# Patient Record
Sex: Female | Born: 1935 | Race: White | Hispanic: No | Smoking: Never smoker
Health system: Southern US, Community
[De-identification: ages and names within clinical notes are randomized; demographics above are authoritative.]

## PROBLEM LIST (undated history)

## (undated) DIAGNOSIS — I509 Heart failure, unspecified: Secondary | ICD-10-CM

## (undated) DIAGNOSIS — N183 Chronic kidney disease, stage 3 unspecified: Secondary | ICD-10-CM

## (undated) DIAGNOSIS — F329 Major depressive disorder, single episode, unspecified: Secondary | ICD-10-CM

## (undated) DIAGNOSIS — E039 Hypothyroidism, unspecified: Secondary | ICD-10-CM

## (undated) DIAGNOSIS — E785 Hyperlipidemia, unspecified: Secondary | ICD-10-CM

## (undated) DIAGNOSIS — F32A Depression, unspecified: Secondary | ICD-10-CM

## (undated) DIAGNOSIS — I10 Essential (primary) hypertension: Secondary | ICD-10-CM

## (undated) DIAGNOSIS — F419 Anxiety disorder, unspecified: Secondary | ICD-10-CM

## (undated) HISTORY — PX: CHOLECYSTECTOMY: SHX55

## (undated) HISTORY — DX: Depression, unspecified: F32.A

## (undated) HISTORY — DX: Anxiety disorder, unspecified: F41.9

## (undated) HISTORY — DX: Major depressive disorder, single episode, unspecified: F32.9

---

## 2005-04-09 ENCOUNTER — Ambulatory Visit: Payer: Self-pay | Admitting: Internal Medicine

## 2006-03-26 ENCOUNTER — Other Ambulatory Visit: Payer: Self-pay

## 2006-03-26 ENCOUNTER — Emergency Department: Payer: Self-pay | Admitting: Emergency Medicine

## 2006-04-10 ENCOUNTER — Ambulatory Visit: Payer: Self-pay | Admitting: Internal Medicine

## 2007-06-28 ENCOUNTER — Ambulatory Visit: Payer: Self-pay | Admitting: Internal Medicine

## 2008-06-14 ENCOUNTER — Ambulatory Visit: Payer: Self-pay | Admitting: Internal Medicine

## 2009-06-15 ENCOUNTER — Ambulatory Visit: Payer: Self-pay | Admitting: Internal Medicine

## 2009-11-26 ENCOUNTER — Ambulatory Visit: Payer: Self-pay | Admitting: Internal Medicine

## 2013-08-18 ENCOUNTER — Emergency Department: Payer: Self-pay | Admitting: Emergency Medicine

## 2014-02-12 ENCOUNTER — Ambulatory Visit: Payer: Self-pay | Admitting: Unknown Physician Specialty

## 2014-04-01 DIAGNOSIS — M5136 Other intervertebral disc degeneration, lumbar region: Secondary | ICD-10-CM | POA: Insufficient documentation

## 2014-04-01 DIAGNOSIS — M5416 Radiculopathy, lumbar region: Secondary | ICD-10-CM | POA: Insufficient documentation

## 2014-04-01 DIAGNOSIS — M51369 Other intervertebral disc degeneration, lumbar region without mention of lumbar back pain or lower extremity pain: Secondary | ICD-10-CM | POA: Insufficient documentation

## 2014-04-01 DIAGNOSIS — M48062 Spinal stenosis, lumbar region with neurogenic claudication: Secondary | ICD-10-CM | POA: Insufficient documentation

## 2014-06-13 DIAGNOSIS — F329 Major depressive disorder, single episode, unspecified: Secondary | ICD-10-CM | POA: Insufficient documentation

## 2014-06-13 DIAGNOSIS — D721 Eosinophilia: Secondary | ICD-10-CM

## 2014-06-13 DIAGNOSIS — F32A Depression, unspecified: Secondary | ICD-10-CM | POA: Insufficient documentation

## 2014-06-13 DIAGNOSIS — R898 Other abnormal findings in specimens from other organs, systems and tissues: Secondary | ICD-10-CM | POA: Insufficient documentation

## 2014-06-13 DIAGNOSIS — H547 Unspecified visual loss: Secondary | ICD-10-CM | POA: Insufficient documentation

## 2014-06-13 DIAGNOSIS — I10 Essential (primary) hypertension: Secondary | ICD-10-CM | POA: Insufficient documentation

## 2014-06-14 DIAGNOSIS — E876 Hypokalemia: Secondary | ICD-10-CM | POA: Insufficient documentation

## 2014-12-13 DIAGNOSIS — Z79899 Other long term (current) drug therapy: Secondary | ICD-10-CM | POA: Insufficient documentation

## 2015-06-15 DIAGNOSIS — G250 Essential tremor: Secondary | ICD-10-CM | POA: Insufficient documentation

## 2015-07-26 DIAGNOSIS — F0280 Dementia in other diseases classified elsewhere without behavioral disturbance: Secondary | ICD-10-CM | POA: Insufficient documentation

## 2015-07-26 DIAGNOSIS — G301 Alzheimer's disease with late onset: Secondary | ICD-10-CM

## 2015-08-24 DIAGNOSIS — F3342 Major depressive disorder, recurrent, in full remission: Secondary | ICD-10-CM | POA: Insufficient documentation

## 2015-10-10 DIAGNOSIS — I340 Nonrheumatic mitral (valve) insufficiency: Secondary | ICD-10-CM | POA: Insufficient documentation

## 2015-11-21 ENCOUNTER — Encounter: Payer: Self-pay | Admitting: Emergency Medicine

## 2015-11-21 ENCOUNTER — Ambulatory Visit
Admission: EM | Admit: 2015-11-21 | Discharge: 2015-11-21 | Disposition: A | Discharge status: Home / Self Care | Payer: Medicare Other | Attending: Family Medicine | Admitting: Family Medicine

## 2015-11-21 DIAGNOSIS — M25512 Pain in left shoulder: Secondary | ICD-10-CM | POA: Insufficient documentation

## 2015-11-21 DIAGNOSIS — Z8679 Personal history of other diseases of the circulatory system: Secondary | ICD-10-CM | POA: Diagnosis not present

## 2015-11-21 HISTORY — DX: Essential (primary) hypertension: I10

## 2015-11-21 NOTE — ED Provider Notes (Addendum)
CSN: [id number]     Arrival date & time 11/21/15  1242 History   First MD Initiated Contact with Patient 11/21/15 1334    Nurses notes were reviewed.   No results found for this or any previous visit. Chief Complaint  Patient presents with  . Arm Pain    Patient is brought in by her nurse son and daughter. Since 82 year old white female who unfortunately lost her husband last month. Apparently for the last 3 days she's been having pain in her left shoulder worrisome point is that the pain less in her left shoulder is also gone upper left neck as well. She's also has some shortness of breath according to daughter she always has shortness of breath when she just felt too much. Patient does have a history of heart disease and she takes Lasix on a regular basis because of CHF history in the past. Patient has difficulty hearing and so because of her difficulty hearing is also difficult to getting a full history and full details. She's not having his physical therapy to help her with overall activity and she's been doing some increased squats her daughter thinks she may have just her shoulder with the increase squatting she is doing.   (Consider location/radiation/quality/duration/timing/severity/associated sxs/prior Treatment) Patient is a 82 y.o. female presenting with arm pain. The history is provided by the patient and a relative. The history is limited by the condition of the patient. No language interpreter was used.  Arm Pain This is a new problem. The current episode started more than 2 days ago. The problem has been gradually worsening. Associated symptoms include shortness of breath. Pertinent negatives include no chest pain, no abdominal pain and no headaches. The symptoms are aggravated by twisting. She has tried nothing for the symptoms. The treatment provided no relief.    Past Medical History  Diagnosis Date  . Hypertension    Past Surgical History  Procedure Laterality Date  .  Cholecystectomy     History reviewed. No pertinent family history. Social History  Substance Use Topics  . Smoking status: Never Smoker   . Smokeless tobacco: None  . Alcohol Use: No   OB History    No data available     Review of Systems  HENT: Positive for hearing loss.   Respiratory: Positive for shortness of breath.   Cardiovascular: Negative for chest pain.  Gastrointestinal: Negative for abdominal pain.  Musculoskeletal: Positive for myalgias.  Neurological: Negative for headaches.    Allergies  Review of patient's allergies indicates no known allergies.  Home Medications   Prior to Admission medications   Medication Sig Start Date End Date Taking? Authorizing Provider  atorvastatin (LIPITOR) 20 MG tablet Take 20 mg by mouth daily.   Yes Historical Provider, MD  citalopram (CELEXA) 20 MG tablet Take 20 mg by mouth daily.   Yes Historical Provider, MD  furosemide (LASIX) 20 MG tablet Take 20 mg by mouth daily.   Yes Historical Provider, MD  HYDROcodone-acetaminophen (NORCO/VICODIN) 5-325 MG tablet Take 1 tablet by mouth every 6 (six) hours as needed for moderate pain.   Yes Historical Provider, MD  levothyroxine (SYNTHROID, LEVOTHROID) 88 MCG tablet Take 88 mcg by mouth daily before breakfast.   Yes Historical Provider, MD  losartan (COZAAR) 50 MG tablet Take 50 mg by mouth daily.   Yes Historical Provider, MD  potassium chloride SA (K-DUR,KLOR-CON) 20 MEQ tablet Take 20 mEq by mouth 2 (two) times daily.   Yes Historical Provider, MD  primidone (MYSOLINE) 50 MG tablet Take 50 mg by mouth daily.   Yes Historical Provider, MD   Meds Ordered and Administered this Visit  Medications - No data to display  BP 148/78 mmHg  Pulse 84  Temp(Src) 98.5 F (36.9 C) (Tympanic)  Resp 16  SpO2 99% No data found.   Physical Exam  Constitutional:  Elderly white female frail  HENT:  Head: Normocephalic.  Right Ear: External ear normal.  Left Ear: External ear normal.    Mouth/Throat: Oropharynx is clear and moist.  Eyes:  Patient is legally blind  Neck: Normal range of motion. Neck supple.  Cardiovascular: Normal rate.  An irregular rhythm present.  Pulmonary/Chest: Breath sounds normal. No respiratory distress.  Abdominal: Soft. Bowel sounds are normal.  Musculoskeletal: She exhibits tenderness.       Left shoulder: She exhibits tenderness and pain.       Arms: Patient is tenderness over the left shoulder along the left deltoid. She is able to move it. When I palpate deltoid muscle distribution of the discomfort she has not the pain she has with the pain radiates up her neck.  Neurological: She is alert.  Skin: Skin is warm.  Psychiatric: She has a normal mood and affect.  Vitals reviewed.   ED Course  Procedures (including critical care time)  Labs Review Labs Reviewed - No data to display  Imaging Review No results found.   Visual Acuity Review  Right Eye Distance:   Left Eye Distance:   Bilateral Distance:    Right Eye Near:   Left Eye Near:    Bilateral Near:         MDM   1. Shoulder pain, acute, left   2. Hx of chronic ischemic heart disease    Patient is here because of left arm pain that radiates up her neck. She has a history of coronary artery disease as well as CHF. At this point of time explained to the family this being an urgent care I cannot Reassure the family that nothing cardiac is going on with the patient. Should be noted that when her son called her regular PCP he did instruct him to take her to the ED and not the urgent care. Coming to urgent care with something that the son and daughter decided to do to expedite her care. And evaluation. Explained to her son and daughter that the urgent care doesn't function in that way and that if they're really concerned about her heart that they should go to the emergency room of the choice for further evaluation. Also offered calling the rescue squad at this time to  facilitate her transfer to the ER. If there comfortable with the risk they can take her there themselves. I also explained to them that even though she is a DO NOT RESUSCITATE it does not mean no treatment and that if she is having any type of infarct that at the ER a cardiologist can make a decision as far as what type of all thrombolytic to use for intervention if it is needed.   ED ECG REPORT   Date: 11/21/2015  EKG Time: 2:16 PM  Rate: 96  Rhythm: sinus arrhythmia and premature ventricular contractions (PVC),  there are no previous tracings available for comparison, nonspecific ST and T waves changes, occasional PVC noted,   Intervals:none  ST&T Change: nonspecific  Narrative Interpretation: patient w/abnormal EKG w/nonspecific St and T wave changes       After discussion with  son and daughter they will take her home and watch her. Patient does not want to go to the ED at this time. They're aware though that if she starts having worsening of her symptoms though need to take her to the ED of the choice or will follow-up with her PCP later this week if the pain continues.     Note: This dictation was prepared with Dragon dictation along with smaller phrase technology. Any transcriptional errors that result from this process are unintentional.  Should be noted that note was edited on 06/07/2016 due to coding query.   Hassan Rowan, MD 11/21/15 1417  Hassan Rowan, MD 11/21/15 1417  Hassan Rowan, MD 06/07/16 1331

## 2015-11-21 NOTE — Discharge Instructions (Signed)
Per discussion with urgent care cannot state that there is nothing cardiac especially ischemic-wise going on with your mother. After discussing the risks of death and missed cardiac evaluation understand that she won't take patient home and observe. This is truly an informed decision family is making at this time. Recommend though that if her condition becomes worse today or tomorrow to please take her to the ED of your choice for furth Musculoskeletal Pain Musculoskeletal pain is muscle and boney aches and pains. These pains can occur in any part of the body. Your caregiver may treat you without knowing the cause of the pain. They may treat you if blood or urine tests, X-rays, and other tests were normal.  CAUSES There is often not a definite cause or reason for these pains. These pains may be caused by a type of germ (virus). The discomfort may also come from overuse. Overuse includes working out too hard when your body is not fit. Boney aches also come from weather changes. Bone is sensitive to atmospheric pressure changes. HOME CARE INSTRUCTIONS  1. Ask when your test results will be ready. Make sure you get your test results. 2. Only take over-the-counter or prescription medicines for pain, discomfort, or fever as directed by your caregiver. If you were given medications for your condition, do not drive, operate machinery or power tools, or sign legal documents for 24 hours. Do not drink alcohol. Do not take sleeping pills or other medications that may interfere with treatment. 3. Continue all activities unless the activities cause more pain. When the pain lessens, slowly resume normal activities. Gradually increase the intensity and duration of the activities or exercise. 4. During periods of severe pain, bed rest may be helpful. Lay or sit in any position that is comfortable. 5. Putting ice on the injured area. 1. Put ice in a bag. 2. Place a towel between your skin and the bag. 3. Leave the ice  on for 15 to 20 minutes, 3 to 4 times a day. 6. Follow up with your caregiver for continued problems and no reason can be found for the pain. If the pain becomes worse or does not go away, it may be necessary to repeat tests or do additional testing. Your caregiver may need to look further for a possible cause. SEEK IMMEDIATE MEDICAL CARE IF: 1. You have pain that is getting worse and is not relieved by medications. 2. You develop chest pain that is associated with shortness or breath, sweating, feeling sick to your stomach (nauseous), or throw up (vomit). 3. Your pain becomes localized to the abdomen. 4. You develop any new symptoms that seem different or that concern you. MAKE SURE YOU:  1. Understand these instructions. 2. Will watch your condition. 3. Will get help right away if you are not doing well or get worse.   This information is not intended to replace advice given to you by your health care provider. Make sure you discuss any questions you have with your health care provider.   Document Released: 09/10/2005 Document Revised: 12/03/2011 Document Reviewed: 05/15/2013 Elsevier Interactive Patient Education 2016 [employer]  Shoulder Pain The shoulder is the joint that connects your arm to your body. Muscles and band-like tissues that connect bones to muscles (tendons) hold the joint together. Shoulder pain is felt if an injury or medical problem affects one or more parts of the shoulder. HOME CARE  7. Put ice on the sore area. 1. Put ice in a plastic bag. 2.  Place a towel between your skin and the bag. 3. Leave the ice on for 15-20 minutes, 03-04 times a day for the first 2 days. 8. Stop using cold packs if they do not help with the pain. 9. If you were given something to keep your shoulder from moving (sling; shoulder immobilizer), wear it as told. Only take it off to shower or bathe. 10. Move your arm as little as possible, but keep your hand moving to prevent puffiness  (swelling). 11. Squeeze a soft ball or foam pad as much as possible to help prevent swelling. 12. Take medicine as told by your doctor. GET HELP IF: 5. You have progressing new pain in your arm, hand, or fingers. 6. Your hand or fingers get cold. 7. Your medicine does not help lessen your pain. GET HELP RIGHT AWAY IF:  4. Your arm, hand, or fingers are numb or tingling. 5. Your arm, hand, or fingers are puffy (swollen), painful, or turn white or blue. MAKE SURE YOU:  1. Understand these instructions. 2. Will watch your condition. 3. Will get help right away if you are not doing well or get worse.   This information is not intended to replace advice given to you by your health care provider. Make sure you discuss any questions you have with your health care provider.   Document Released: 02/27/2008 Document Revised: 10/01/2014 Document Reviewed: 01/03/2015 Elsevier Interactive Patient Education 2016 [employer]  Shoulder Range of Motion Exercises Shoulder range of motion (ROM) exercises are designed to keep the shoulder moving freely. They are often recommended for people who have shoulder pain. MOVEMENT EXERCISE When you are able, do this exercise 5-6 days per week, or as told by your health care provider. Work toward doing 2 sets of 10 swings. Pendulum Exercise How To Do This Exercise Lying Down 13. Lie face-down on a bed with your abdomen close to the side of the bed. 14. Let your arm hang over the side of the bed. 15. Relax your shoulder, arm, and hand. 16. Slowly and gently swing your arm forward and back. Do not use your neck muscles to swing your arm. They should be relaxed. If you are struggling to swing your arm, have someone gently swing it for you. When you do this exercise for the first time, swing your arm at a 15 degree angle for 15 seconds, or swing your arm 10 times. As pain lessens over time, increase the angle of the swing to 30-45 degrees. 17. Repeat steps 1-4 with  the other arm. How To Do This Exercise While Standing 8. Stand next to a sturdy chair or table and hold on to it with your hand.  Bend forward at the waist.  Bend your knees slightly.  Relax your other arm and let it hang limp.  Relax the shoulder blade of the arm that is hanging and let it drop.  While keeping your shoulder relaxed, use body motion to swing your arm in small circles. The first time you do this exercise, swing your arm for about 30 seconds or 10 times. When you do it next time, swing your arm for a little longer.  Stand up tall and relax.  Repeat steps 1-7, this time changing the direction of the circles. 9. Repeat steps 1-8 with the other arm. STRETCHING EXERCISES Do these exercises 3-4 times per day on 5-6 days per week or as told by your health care provider. Work toward holding the stretch for 20 seconds. Stretching  Exercise 1 6. Lift your arm straight out in front of you. 7. Bend your arm 90 degrees at the elbow (right angle) so your forearm goes across your body and looks like the letter "L." 8. Use your other arm to gently pull the elbow forward and across your body. 9. Repeat steps 1-3 with the other arm. Stretching Exercise 2 You will need a towel or rope for this exercise. 4. Bend one arm behind your back with the palm facing outward. 5. Hold a towel with your other hand. 6. Reach the arm that holds the towel above your head, and bend that arm at the elbow. Your wrist should be behind your neck. 7. Use your free hand to grab the free end of the towel. 8. With the higher hand, gently pull the towel up behind you. 9. With the lower hand, pull the towel down behind you. 10. Repeat steps 1-6 with the other arm. STRENGTHENING EXERCISES Do each of these exercises at four different times of day (sessions) every day or as told by your health care provider. To begin with, repeat each exercise 5 times (repetitions). Work toward doing 3 sets of 12 repetitions or as  told by your health care provider. Strengthening Exercise 1 You will need a light weight for this activity. As you grow stronger, you may use a heavier weight. 1. Standing with a weight in your hand, lift your arm straight out to the side until it is at the same height as your shoulder. 2. Bend your arm at 90 degrees so that your fingers are pointing to the ceiling. 3. Slowly raise your hand until your arm is straight up in the air. 4. Repeat steps 1-3 with the other arm. Strengthening Exercise 2 You will need a light weight for this activity. As you grow stronger, you may use a heavier weight. 1. Standing with a weight in your hand, gradually move your straight arm in an arc, starting at your side, then out in front of you, then straight up over your head. 2. Gradually move your other arm in an arc, starting at your side, then out in front of you, then straight up over your head. 3. Repeat steps 1-2 with the other arm. Strengthening Exercise 3 You will need an elastic band for this activity. As you grow stronger, gradually increase the size of the bands or increase the number of bands that you use at one time. 1. While standing, hold an elastic band in one hand and raise that arm up in the air. 2. With your other hand, pull down the band until that hand is by your side. 3. Repeat steps 1-2 with the other arm.   This information is not intended to replace advice given to you by your health care provider. Make sure you discuss any questions you have with your health care provider.   Document Released: 06/09/2003 Document Revised: 01/25/2015 Document Reviewed: 09/06/2014 Elsevier Interactive Patient Education [employer] er evaluation will respect her wish to take her home at this time.

## 2015-11-21 NOTE — ED Notes (Signed)
Patient c/o left arm pain that started 3 days.  Caregiver denies chest pain.  Patient denies SOB.

## 2015-12-14 DIAGNOSIS — M19019 Primary osteoarthritis, unspecified shoulder: Secondary | ICD-10-CM | POA: Insufficient documentation

## 2015-12-26 DIAGNOSIS — I824Y2 Acute embolism and thrombosis of unspecified deep veins of left proximal lower extremity: Secondary | ICD-10-CM | POA: Insufficient documentation

## 2016-02-09 ENCOUNTER — Inpatient Hospital Stay
Admission: EM | Admit: 2016-02-09 | Discharge: 2016-02-13 | DRG: 378 | Disposition: A | Discharge status: Home / Self Care | Payer: Medicare Other | Attending: Internal Medicine | Admitting: Internal Medicine

## 2016-02-09 ENCOUNTER — Encounter: Payer: Self-pay | Admitting: Emergency Medicine

## 2016-02-09 ENCOUNTER — Emergency Department: Payer: Medicare Other

## 2016-02-09 DIAGNOSIS — K259 Gastric ulcer, unspecified as acute or chronic, without hemorrhage or perforation: Secondary | ICD-10-CM | POA: Insufficient documentation

## 2016-02-09 DIAGNOSIS — Z86718 Personal history of other venous thrombosis and embolism: Secondary | ICD-10-CM | POA: Diagnosis not present

## 2016-02-09 DIAGNOSIS — Z66 Do not resuscitate: Secondary | ICD-10-CM | POA: Diagnosis present

## 2016-02-09 DIAGNOSIS — R197 Diarrhea, unspecified: Secondary | ICD-10-CM | POA: Diagnosis present

## 2016-02-09 DIAGNOSIS — Z9049 Acquired absence of other specified parts of digestive tract: Secondary | ICD-10-CM

## 2016-02-09 DIAGNOSIS — Z79899 Other long term (current) drug therapy: Secondary | ICD-10-CM | POA: Diagnosis not present

## 2016-02-09 DIAGNOSIS — N183 Chronic kidney disease, stage 3 unspecified: Secondary | ICD-10-CM | POA: Diagnosis present

## 2016-02-09 DIAGNOSIS — I509 Heart failure, unspecified: Secondary | ICD-10-CM | POA: Diagnosis present

## 2016-02-09 DIAGNOSIS — E876 Hypokalemia: Secondary | ICD-10-CM | POA: Diagnosis present

## 2016-02-09 DIAGNOSIS — E039 Hypothyroidism, unspecified: Secondary | ICD-10-CM | POA: Diagnosis present

## 2016-02-09 DIAGNOSIS — K922 Gastrointestinal hemorrhage, unspecified: Secondary | ICD-10-CM | POA: Diagnosis present

## 2016-02-09 DIAGNOSIS — Z8249 Family history of ischemic heart disease and other diseases of the circulatory system: Secondary | ICD-10-CM | POA: Diagnosis not present

## 2016-02-09 DIAGNOSIS — D649 Anemia, unspecified: Secondary | ICD-10-CM

## 2016-02-09 DIAGNOSIS — I10 Essential (primary) hypertension: Secondary | ICD-10-CM | POA: Diagnosis present

## 2016-02-09 DIAGNOSIS — I82409 Acute embolism and thrombosis of unspecified deep veins of unspecified lower extremity: Secondary | ICD-10-CM | POA: Diagnosis present

## 2016-02-09 DIAGNOSIS — K921 Melena: Secondary | ICD-10-CM | POA: Diagnosis present

## 2016-02-09 DIAGNOSIS — D62 Acute posthemorrhagic anemia: Secondary | ICD-10-CM | POA: Diagnosis present

## 2016-02-09 DIAGNOSIS — H9190 Unspecified hearing loss, unspecified ear: Secondary | ICD-10-CM | POA: Diagnosis present

## 2016-02-09 DIAGNOSIS — I130 Hypertensive heart and chronic kidney disease with heart failure and stage 1 through stage 4 chronic kidney disease, or unspecified chronic kidney disease: Secondary | ICD-10-CM | POA: Diagnosis present

## 2016-02-09 DIAGNOSIS — Z7982 Long term (current) use of aspirin: Secondary | ICD-10-CM

## 2016-02-09 DIAGNOSIS — R609 Edema, unspecified: Secondary | ICD-10-CM

## 2016-02-09 DIAGNOSIS — M549 Dorsalgia, unspecified: Secondary | ICD-10-CM

## 2016-02-09 DIAGNOSIS — Z8051 Family history of malignant neoplasm of kidney: Secondary | ICD-10-CM | POA: Diagnosis not present

## 2016-02-09 DIAGNOSIS — H540 Blindness, both eyes: Secondary | ICD-10-CM | POA: Diagnosis present

## 2016-02-09 DIAGNOSIS — Z7902 Long term (current) use of antithrombotics/antiplatelets: Secondary | ICD-10-CM

## 2016-02-09 DIAGNOSIS — Z823 Family history of stroke: Secondary | ICD-10-CM

## 2016-02-09 DIAGNOSIS — E785 Hyperlipidemia, unspecified: Secondary | ICD-10-CM | POA: Diagnosis present

## 2016-02-09 DIAGNOSIS — K253 Acute gastric ulcer without hemorrhage or perforation: Secondary | ICD-10-CM | POA: Diagnosis not present

## 2016-02-09 DIAGNOSIS — K254 Chronic or unspecified gastric ulcer with hemorrhage: Secondary | ICD-10-CM | POA: Diagnosis not present

## 2016-02-09 HISTORY — DX: Hypothyroidism, unspecified: E03.9

## 2016-02-09 HISTORY — DX: Chronic kidney disease, stage 3 unspecified: N18.30

## 2016-02-09 HISTORY — DX: Heart failure, unspecified: I50.9

## 2016-02-09 HISTORY — DX: Hyperlipidemia, unspecified: E78.5

## 2016-02-09 HISTORY — DX: Chronic kidney disease, stage 3 (moderate): N18.3

## 2016-02-09 LAB — BASIC METABOLIC PANEL
Anion gap: 9 (ref 5–15)
BUN: 51 mg/dL — ABNORMAL HIGH (ref 6–20)
CALCIUM: 9.1 mg/dL (ref 8.9–10.3)
CO2: 22 mmol/L (ref 22–32)
Chloride: 103 mmol/L (ref 101–111)
Creatinine, Ser: 1.16 mg/dL — ABNORMAL HIGH (ref 0.44–1.00)
GFR calc Af Amer: 46 mL/min — ABNORMAL LOW (ref >60)
GFR calc non Af Amer: 40 mL/min — ABNORMAL LOW (ref >60)
GLUCOSE: 155 mg/dL — AB (ref 65–99)
POTASSIUM: 4.3 mmol/L (ref 3.5–5.1)
SODIUM: 134 mmol/L — AB (ref 135–145)

## 2016-02-09 LAB — CBC WITH DIFFERENTIAL/PLATELET
Basophils Absolute: 0 10*3/uL (ref 0–0.1)
Basophils Relative: 0 %
EOS ABS: 0.3 10*3/uL (ref 0–0.7)
HCT: 29 % — ABNORMAL LOW (ref 35.0–47.0)
Hemoglobin: 9.3 g/dL — ABNORMAL LOW (ref 12.0–16.0)
LYMPHS ABS: 1.8 10*3/uL (ref 1.0–3.6)
Lymphocytes Relative: 14 %
MCH: 28.5 pg (ref 26.0–34.0)
MCHC: 32.1 g/dL (ref 32.0–36.0)
MCV: 89 fL (ref 80.0–100.0)
Monocytes Absolute: 0.8 10*3/uL (ref 0.2–0.9)
Monocytes Relative: 7 %
Neutro Abs: 9.8 10*3/uL — ABNORMAL HIGH (ref 1.4–6.5)
Neutrophils Relative %: 77 %
PLATELETS: 245 10*3/uL (ref 150–440)
RBC: 3.26 MIL/uL — AB (ref 3.80–5.20)
RDW: 14.8 % — ABNORMAL HIGH (ref 11.5–14.5)
WBC: 12.7 10*3/uL — AB (ref 3.6–11.0)

## 2016-02-09 LAB — TROPONIN I: Troponin I: <0.03 ng/mL (ref <0.031)

## 2016-02-09 MED ORDER — ATORVASTATIN CALCIUM 20 MG PO TABS
20.0000 mg | ORAL_TABLET | Freq: Every day | ORAL | Status: DC
  Administered 2016-02-10 – 2016-02-13 (×4): 20 mg via ORAL
  Filled 2016-02-09 (×4): qty 1

## 2016-02-09 MED ORDER — SODIUM CHLORIDE 0.9 % IV BOLUS (SEPSIS)
500.0000 mL | Freq: Once | INTRAVENOUS | Status: AC
  Administered 2016-02-09: 500 mL via INTRAVENOUS

## 2016-02-09 MED ORDER — ONDANSETRON HCL 4 MG PO TABS: 4.0000 mg | ORAL_TABLET | Freq: Four times a day (QID) | ORAL | Status: DC | PRN

## 2016-02-09 MED ORDER — ACETAMINOPHEN 650 MG RE SUPP: 650.0000 mg | Freq: Four times a day (QID) | RECTAL | Status: DC | PRN

## 2016-02-09 MED ORDER — IOPAMIDOL (ISOVUE-370) INJECTION 76%
75.0000 mL | Freq: Once | INTRAVENOUS | Status: AC | PRN
  Administered 2016-02-09: 75 mL via INTRAVENOUS

## 2016-02-09 MED ORDER — CITALOPRAM HYDROBROMIDE 20 MG PO TABS
20.0000 mg | ORAL_TABLET | Freq: Every day | ORAL | Status: DC
  Administered 2016-02-10 – 2016-02-13 (×4): 20 mg via ORAL
  Filled 2016-02-09 (×4): qty 1

## 2016-02-09 MED ORDER — SODIUM CHLORIDE 0.9 % IV SOLN
Freq: Once | INTRAVENOUS | Status: AC
  Administered 2016-02-09: 21:00:00 via INTRAVENOUS

## 2016-02-09 MED ORDER — SODIUM CHLORIDE 0.9% FLUSH
3.0000 mL | Freq: Two times a day (BID) | INTRAVENOUS | Status: DC
  Administered 2016-02-10 – 2016-02-13 (×4): 3 mL via INTRAVENOUS

## 2016-02-09 MED ORDER — PANTOPRAZOLE SODIUM 40 MG IV SOLR
40.0000 mg | Freq: Once | INTRAVENOUS | Status: AC
  Administered 2016-02-09: 40 mg via INTRAVENOUS
  Filled 2016-02-09: qty 40

## 2016-02-09 MED ORDER — ONDANSETRON HCL 4 MG/2ML IJ SOLN: 4.0000 mg | Freq: Four times a day (QID) | INTRAMUSCULAR | Status: DC | PRN

## 2016-02-09 MED ORDER — PANTOPRAZOLE SODIUM 40 MG IV SOLR
40.0000 mg | Freq: Two times a day (BID) | INTRAVENOUS | Status: DC
  Administered 2016-02-10: 40 mg via INTRAVENOUS
  Filled 2016-02-09: qty 40

## 2016-02-09 MED ORDER — ASPIRIN 325 MG PO TABS
325.0000 mg | ORAL_TABLET | Freq: Every day | ORAL | Status: DC
Start: 2016-02-10 — End: 2016-02-10

## 2016-02-09 MED ORDER — ACETAMINOPHEN 325 MG PO TABS: 650.0000 mg | ORAL_TABLET | Freq: Four times a day (QID) | ORAL | Status: DC | PRN

## 2016-02-09 MED ORDER — SODIUM CHLORIDE 0.9 % IV SOLN
INTRAVENOUS | Status: AC
  Administered 2016-02-09: via INTRAVENOUS

## 2016-02-09 MED ORDER — LEVOTHYROXINE SODIUM 88 MCG PO TABS
88.0000 ug | ORAL_TABLET | Freq: Every day | ORAL | Status: DC
  Administered 2016-02-11 – 2016-02-13 (×3): 88 ug via ORAL
  Filled 2016-02-09 (×3): qty 1

## 2016-02-09 MED ORDER — DONEPEZIL HCL 5 MG PO TABS
5.0000 mg | ORAL_TABLET | Freq: Every day | ORAL | Status: DC
  Administered 2016-02-10 – 2016-02-13 (×4): 5 mg via ORAL
  Filled 2016-02-09 (×4): qty 1

## 2016-02-09 MED ORDER — HYDROCODONE-ACETAMINOPHEN 5-325 MG PO TABS: 0.5000 | ORAL_TABLET | Freq: Two times a day (BID) | ORAL | Status: DC | PRN

## 2016-02-09 NOTE — H&P (Signed)
[facility] - [place] at [facility]   PATIENT NAME: Erin Snyder    MR#:  [id number]  DATE OF BIRTH:  [birth year]  DATE OF ADMISSION:  02/09/2016  PRIMARY CARE PHYSICIAN: Mickey Farber, MD   REQUESTING/REFERRING PHYSICIAN: Inocencio Homes, MD  CHIEF COMPLAINT:   Chief Complaint  Patient presents with  . Diarrhea    sudden onset after eating    HISTORY OF PRESENT ILLNESS:  Erin Snyder  is a 82 y.o. female who presents with an episode of what she describes as "explosive" diarrhea.  On evaluation in the ED she is found to have Hgb 9, down from her normal level at 12.  She has recently started taking eliquis to treat left lower extremity DVT.  Given concern for GI bleed, hospitalists were called for admission and further evaluation.  PAST MEDICAL HISTORY:   Past Medical History  Diagnosis Date  . Hypertension   . CHF (congestive heart failure) (HCC)   . Hypothyroidism   . CKD (chronic kidney disease), stage III   . HLD (hyperlipidemia)     PAST SURGICAL HISTORY:   Past Surgical History  Procedure Laterality Date  . Cholecystectomy      SOCIAL HISTORY:   Social History  Substance Use Topics  . Smoking status: Never Smoker   . Smokeless tobacco: Not on file  . Alcohol Use: No    FAMILY HISTORY:   Family History  Problem Relation Age of Onset  . Kidney cancer    . Cerebral palsy    . Heart attack    . Stroke      DRUG ALLERGIES:  No Known Allergies  MEDICATIONS AT HOME:   Prior to Admission medications   Medication Sig Start Date End Date Taking? Authorizing Provider  apixaban (ELIQUIS) 5 MG TABS tablet Take 5 mg by mouth daily.   Yes Historical Provider, MD  aspirin 325 MG tablet Take 325 mg by mouth daily.   Yes Historical Provider, MD  atorvastatin (LIPITOR) 20 MG tablet Take 20 mg by mouth daily.   Yes Historical Provider, MD  Calcium Carbonate-Vitamin D3 (CALCIUM 600+D3) 600-400 MG-UNIT TABS Take 1 tablet by mouth daily.   Yes  Historical Provider, MD  citalopram (CELEXA) 20 MG tablet Take 20 mg by mouth daily.   Yes Historical Provider, MD  donepezil (ARICEPT) 5 MG tablet Take 5 mg by mouth daily. Starting 02/23/16, Pt to take 10 mg Donepezil once daily.   Yes Historical Provider, MD  ferrous sulfate 325 (65 FE) MG tablet Take 325 mg by mouth daily.   Yes Historical Provider, MD  furosemide (LASIX) 20 MG tablet Take 20 mg by mouth daily.   Yes Historical Provider, MD  HYDROcodone-acetaminophen (NORCO/VICODIN) 5-325 MG tablet Take 0.5-1 tablets by mouth 2 (two) times daily as needed for moderate pain.    Yes Historical Provider, MD  levothyroxine (SYNTHROID, LEVOTHROID) 88 MCG tablet Take 88 mcg by mouth daily before breakfast.   Yes Historical Provider, MD  losartan (COZAAR) 50 MG tablet Take 50 mg by mouth daily.   Yes Historical Provider, MD  Multiple Vitamin (MULTIVITAMIN) tablet Take 1 tablet by mouth daily.   Yes Historical Provider, MD  potassium chloride SA (K-DUR,KLOR-CON) 20 MEQ tablet Take 40 mEq by mouth 2 (two) times daily.    Yes Historical Provider, MD  primidone (MYSOLINE) 50 MG tablet Take 50 mg by mouth 2 (two) times daily.    Yes Historical Provider, MD  triamcinolone ointment (KENALOG) 0.1 % Apply  1 application topically 2 (two) times daily as needed.   Yes Historical Provider, MD    REVIEW OF SYSTEMS:  Review of Systems  Constitutional: Negative for fever, chills, weight loss and malaise/fatigue.  HENT: Positive for hearing loss (chronic). Negative for ear pain and tinnitus.        Chronic blindness  Eyes: Negative for blurred vision, double vision, pain and redness.  Respiratory: Negative for cough, hemoptysis and shortness of breath.   Cardiovascular: Negative for chest pain, palpitations, orthopnea and leg swelling.  Gastrointestinal: Positive for diarrhea. Negative for nausea, vomiting, abdominal pain and constipation.  Genitourinary: Negative for dysuria, frequency and hematuria.   Musculoskeletal: Negative for back pain, joint pain and neck pain.  Skin:       No acne, rash, or lesions  Neurological: Negative for dizziness, tremors, focal weakness and weakness.  Endo/Heme/Allergies: Negative for polydipsia. Does not bruise/bleed easily.  Psychiatric/Behavioral: Negative for depression. The patient is not nervous/anxious and does not have insomnia.      VITAL SIGNS:   Filed Vitals:   02/09/16 1915 02/09/16 2000 02/09/16 2030 02/09/16 2100  BP: 95/52 97/49 105/57 102/67  Pulse:  78 81   Temp:      Resp: 18   24  Height:      Weight:      SpO2: 96% 94% 100%    Wt Readings from Last 3 Encounters:  02/09/16 76.431 kg (168 lb 8 oz)    PHYSICAL EXAMINATION:  Physical Exam  Vitals reviewed. Constitutional: She is oriented to person, place, and time. She appears well-developed and well-nourished. No distress.  HENT:  Head: Normocephalic and atraumatic.  Mouth/Throat: Oropharynx is clear and moist.  Eyes: Conjunctivae and EOM are normal. Pupils are equal, round, and reactive to light. No scleral icterus.  Neck: Normal range of motion. Neck supple. No JVD present. No thyromegaly present.  Cardiovascular: Normal rate, regular rhythm and intact distal pulses.  Exam reveals no gallop and no friction rub.   No murmur heard. Respiratory: Effort normal and breath sounds normal. No respiratory distress. She has no wheezes. She has no rales.  GI: Soft. Bowel sounds are normal. She exhibits no distension. There is no tenderness.  Musculoskeletal: Normal range of motion. She exhibits no edema.  No arthritis, no gout  Lymphadenopathy:    She has no cervical adenopathy.  Neurological: She is alert and oriented to person, place, and time. No cranial nerve deficit.  No dysarthria, no aphasia  Skin: Skin is warm and dry. No rash noted. No erythema.  Psychiatric: She has a normal mood and affect. Her behavior is normal. Judgment and thought content normal.    LABORATORY  PANEL:   CBC  Recent Labs Lab 02/09/16 1919  WBC 12.7*  HGB 9.3*  HCT 29.0*  PLT 245   ------------------------------------------------------------------------------------------------------------------  Chemistries   Recent Labs Lab 02/09/16 1919  NA 134*  K 4.3  CL 103  CO2 22  GLUCOSE 155*  BUN 51*  CREATININE 1.16*  CALCIUM 9.1   ------------------------------------------------------------------------------------------------------------------  Cardiac Enzymes  Recent Labs Lab 02/09/16 1918  TROPONINI <0.03   ------------------------------------------------------------------------------------------------------------------  RADIOLOGY:  Ct Angio Abd/pel W/ And/or W/o  02/09/2016  CLINICAL DATA:  82 year old female with back pain EXAM: CTA ABDOMEN AND PELVIS wITHOUT AND WITH CONTRAST TECHNIQUE: Multidetector CT imaging of the abdomen and pelvis was performed using the standard protocol during bolus administration of intravenous contrast. Multiplanar reconstructed images and MIPs were obtained and reviewed to evaluate the vascular anatomy.  CONTRAST:  75 cc Isovue 370 COMPARISON:  Lumbar spine MRI dated 02/12/2014 FINDINGS: There is emphysematous changes of the lung bases. No intra-abdominal free air or free fluid. Cholecystectomy. There is a 2.2 cm stone in the uncinate process of the pancreas or within the central CBD at the head of the pancreas. There is dilatation central CBD measuring up to 14 mm. A 4 mm stone is noted in the head of the pancreas above the larger calculus. There is diffuse dilatation of the main pancreatic duct with gland atrophy. There is a 3.1 x 4.1 cm hypodensity in the head and uncinate process of the pancreas. This likely represents a hypodense lesion in the head of the pancreas and less likely related to dilated pancreatic ducts. Further evaluation with MRI and MRCP recommended. The spleen and adrenal glands are grossly unremarkable. Bilateral renal  hypodense lesions measuring up to 3.2 cm in the upper pole of the right kidney are not well characterized. The larger lesion in the upper pole of the right kidney likely represents a cyst. An ill-defined 1.5 cm hypodense lesion in the interpolar aspect of the left kidney is not well characterized on this exam. MRI may provide better characterization. There is no hydronephrosis on either side. The visualized ureters and urinary bladder appear unremarkable. The uterus is anteverted. Small focus of calcification within the uterus likely related to a calcified fibroid. The visualized ovaries are grossly unremarkable. There is sigmoid diverticulosis without active inflammatory changes. There is no evidence of bowel obstruction or active inflammation. Normal appendix. There is aortoiliac atherosclerotic disease. There is no dissection or aneurysmal dilatation of the abdominal aorta. There is atherosclerotic calcification of the origins of the celiac axis, SMA, and the origins of the renal arteries. The origins of the celiac axis, SMA, IMA as well as the origins of the renal arteries however remain patent. 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 The splenic vein, main portal vein, and SMV appear patent. No portal venous gas identified. There is apparent thickening of the left femoral pains with luminal narrowing. This may be related to timing of the contrast and mixing artifact or represent chronic thrombus/scarring. Acute thrombus is less likely but not excluded. Clinical correlation is recommended. Duplex ultrasound of these veins in may provide better evaluation if there is clinical concern for DVT. There is no adenopathy. There is a small fat containing umbilical hernia. A ventral hernia repair mesh may be present. The abdominal wall soft tissues are otherwise unremarkable. There is osteopenia with degenerative changes of the spine and grade 1 L5-S1 anterolisthesis. Multilevel disc desiccation  with vacuum phenomena. No acute fracture. Old healed left rib fractures partially visualized. Review of the MIP images confirms the above findings. IMPRESSION: No CT evidence of abdominal aortic dissection or aneurysm. 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 Mixing artifact versus a chronic thrombus/scarring in the left femoral venous system. Acute thrombus is less likely. Duplex ultrasound may provide better evaluation of these veins if there is clinical concern for acute DVT. A 3.1 x 4.1 cm hypodense lesion at the head of the pancreas with associated gland atrophy with dilatation of the main pancreatic duct. MRI without and with contrast is recommended for further characterization. A 2.2 cm calcific density in the uncinate process of the pancreas may represent focal calcification of the pancreatic lesion or represent a stone within the central CBD. This can be further evaluated on the MRI with additional MRCP images. Sigmoid diverticulosis with no  evidence of bowel obstruction or active inflammation. Electronically Signed   By: Elgie Collard M.D.   On: 02/09/2016 21:22    EKG:   Orders placed or performed during the hospital encounter of 02/09/16  . ED EKG  . ED EKG    IMPRESSION AND PLAN:  Principal Problem:   GI bleed - bid PPI, NPO for now, trend Hgb, monitor for blood in stool, GI consult. Active Problems:   Deep vein thrombosis (DVT) (HCC) - hold eliquis for tonight, foot pump for dvt ppx, will need to determine appropriate treatment for dvt based on findings of above workup   HTN (hypertension) - not elevated, hold antihypertensives for now   CKD (chronic kidney disease), stage III - monitor closely, avoid nephrotoxins   HLD (hyperlipidemia) - continue home meds   Hypothyroidism - home dose thyroid replacement  All the records are reviewed and case discussed with ED provider. Management plans discussed with the patient and/or family.  DVT PROPHYLAXIS: Mechanical  only  GI PROPHYLAXIS: PPI  ADMISSION STATUS: Inpatient  CODE STATUS: DNR, patient has yellow DNR form Code Status History    This patient does not have a recorded code status. Please follow your organizational policy for patients in this situation.      TOTAL TIME TAKING CARE OF THIS PATIENT: 45 minutes.    Chandra Feger FIELDING 02/09/2016, 10:14 PM  [facility]  Office  [phone]  CC: Primary care physician; Mickey Farber, MD

## 2016-02-09 NOTE — ED Notes (Signed)
Diarrhea after eating

## 2016-02-09 NOTE — ED Provider Notes (Signed)
[facility] Emergency Department Provider Note   ____________________________________________  Time seen: On EMS arrival  I have reviewed the triage vital signs and the nursing notes.   HISTORY  Chief Complaint Diarrhea   History limited by: Not Limited   HPI Erin Snyder is a 82 y.o. female who presents to the emergency department today because of concerns for diarrhea. Per EMS patient had pork chops tonight. Shortly after that the patient had "explosive" diarrhea. The family states that the patient then continued to have diarrhea. There is no vomiting. The patient herself denies any associated abdominal pain. No recent fevers. The bowel my examination the patient states she feels okay. Vital signs were concerning findings for EMS.   Past Medical History  Diagnosis Date  . Hypertension     There are no active problems to display for this patient.   Past Surgical History  Procedure Laterality Date  . Cholecystectomy      Current Outpatient Rx  Name  Route  Sig  Dispense  Refill  . atorvastatin (LIPITOR) 20 MG tablet   Oral   Take 20 mg by mouth daily.         . citalopram (CELEXA) 20 MG tablet   Oral   Take 20 mg by mouth daily.         . furosemide (LASIX) 20 MG tablet   Oral   Take 20 mg by mouth daily.         Marland Kitchen HYDROcodone-acetaminophen (NORCO/VICODIN) 5-325 MG tablet   Oral   Take 1 tablet by mouth every 6 (six) hours as needed for moderate pain.         Marland Kitchen levothyroxine (SYNTHROID, LEVOTHROID) 88 MCG tablet   Oral   Take 88 mcg by mouth daily before breakfast.         . losartan (COZAAR) 50 MG tablet   Oral   Take 50 mg by mouth daily.         . potassium chloride SA (K-DUR,KLOR-CON) 20 MEQ tablet   Oral   Take 20 mEq by mouth 2 (two) times daily.         . primidone (MYSOLINE) 50 MG tablet   Oral   Take 50 mg by mouth daily.           Allergies Review of patient's allergies indicates no known  allergies.  History reviewed. No pertinent family history.  Social History Social History  Substance Use Topics  . Smoking status: Never Smoker   . Smokeless tobacco: None  . Alcohol Use: No    Review of Systems  Constitutional: Negative for fever. Cardiovascular: Negative for chest pain. Respiratory: Negative for shortness of breath. Gastrointestinal: Positive for diarrhea Neurological: Negative for headaches, focal weakness or numbness.  10-point ROS otherwise negative.  ____________________________________________   PHYSICAL EXAM:  VITAL SIGNS: ED Triage Vitals  Enc Vitals Group     BP 02/09/16 1915 95/52 mmHg     Pulse --      Resp 02/09/16 1915 18     Temp 02/09/16 1913 98.4 F (36.9 C)     Temp src --      SpO2 02/09/16 1915 96 %     Weight 02/09/16 1913 168 lb 8 oz (76.431 kg)     Height 02/09/16 1913  (1.575 m)   Constitutional: Alert and oriented. Well appearing and in no distress. Eyes: Conjunctivae are normal. PERRL. Normal extraocular movements. ENT   Head: Normocephalic and atraumatic.  Nose: No congestion/rhinnorhea.   Mouth/Throat: Mucous membranes are moist.   Neck: No stridor. Hematological/Lymphatic/Immunilogical: No cervical lymphadenopathy. Cardiovascular: Normal rate, regular rhythm.  No murmurs, rubs, or gallops. Respiratory: Normal respiratory effort without tachypnea nor retractions. Breath sounds are clear and equal bilaterally. No wheezes/rales/rhonchi. Gastrointestinal: Soft and nontender. No distention. There is no CVA tenderness. Rectal: Melenotic stool on glove. GUIAC positive.  Genitourinary: Deferred Musculoskeletal: Normal range of motion in all extremities. No joint effusions.  No lower extremity tenderness nor edema. Neurologic:  Normal speech and language. No gross focal neurologic deficits are appreciated.  Skin:  Skin is warm, dry and intact. No rash noted. Psychiatric: Mood and affect are normal. Speech  and behavior are normal. Patient exhibits appropriate insight and judgment.  ____________________________________________    LABS (pertinent positives/negatives)  Labs Reviewed  CBC WITH DIFFERENTIAL/PLATELET - Abnormal; Notable for the following:    WBC 12.7 (*)    RBC 3.26 (*)    Hemoglobin 9.3 (*)    HCT 29.0 (*)    RDW 14.8 (*)    Neutro Abs 9.8 (*)    All other components within normal limits  BASIC METABOLIC PANEL - Abnormal; Notable for the following:    Sodium 134 (*)    Glucose, Bld 155 (*)    BUN 51 (*)    Creatinine, Ser 1.16 (*)    GFR calc non Af Amer 40 (*)    GFR calc Af Amer 46 (*)    All other components within normal limits  TROPONIN I  TYPE AND SCREEN     ____________________________________________   EKG  None  ____________________________________________    RADIOLOGY  CT angio pending  ____________________________________________   PROCEDURES  Procedure(s) performed: None  Critical Care performed: No  ____________________________________________   INITIAL IMPRESSION / ASSESSMENT AND PLAN / ED COURSE  Pertinent labs & imaging results that were available during my care of the patient were reviewed by me and considered in my medical decision making (see chart for details).  Patient presents to the emergency department today via EMS secondary to diarrhea. Patient did not complain of any abdominal pain with an abdominal exam is benign here. Will check blood work. Will plan on giving light hydration.  ----------------------------------------- 8:34 PM on 02/09/2016 -----------------------------------------  Blood work was significant for anemia. Per daughter patient has no history of anemia. Will plan on getting a CT angiogram to evaluate for AAA given somewhat soft blood pressure. Additionally will type and screen blood.  ____________________________________________   FINAL CLINICAL IMPRESSION(S) / ED DIAGNOSES  Diarrhea Gi  Bleed Anemia  Phineas SemenGraydon Rafaelita Foister, MD 02/09/16 2034

## 2016-02-09 NOTE — ED Notes (Signed)
Per family pt may have had a syncopal episode after the diarrhea. Per family pt was laying on the bed after the incident and they had to take her in and shower her off.  Dr aware and ekg and troponin added on.

## 2016-02-09 NOTE — ED Provider Notes (Signed)
-----------------------------------------   9:42 PM on 02/09/2016 -----------------------------------------  Care was assumed from Dr. Derrill KayGoodman at 8:30 PM pending results of CTA of the abdomen and pelvis which show no dissection or aneurysm of the aorta. Blood  pressure is improving, case discussed with the hospitalist, Dr. Anne HahnWillis, for admission at this time.  Gayla DossEryka A Zhamir Pirro, MD 02/09/16 [phone]

## 2016-02-10 ENCOUNTER — Inpatient Hospital Stay: Payer: Medicare Other | Admitting: Registered Nurse

## 2016-02-10 ENCOUNTER — Encounter
Admission: EM | Disposition: A | Discharge status: Home / Self Care | Payer: Self-pay | Source: Home / Self Care | Attending: Internal Medicine

## 2016-02-10 ENCOUNTER — Encounter: Payer: Self-pay | Admitting: *Deleted

## 2016-02-10 DIAGNOSIS — K253 Acute gastric ulcer without hemorrhage or perforation: Secondary | ICD-10-CM

## 2016-02-10 DIAGNOSIS — K259 Gastric ulcer, unspecified as acute or chronic, without hemorrhage or perforation: Secondary | ICD-10-CM | POA: Insufficient documentation

## 2016-02-10 HISTORY — PX: ESOPHAGOGASTRODUODENOSCOPY (EGD) WITH PROPOFOL: SHX5813

## 2016-02-10 LAB — BASIC METABOLIC PANEL
ANION GAP: 5 (ref 5–15)
BUN: 47 mg/dL — ABNORMAL HIGH (ref 6–20)
CALCIUM: 8.8 mg/dL — AB (ref 8.9–10.3)
CHLORIDE: 109 mmol/L (ref 101–111)
CO2: 24 mmol/L (ref 22–32)
CREATININE: 1.12 mg/dL — AB (ref 0.44–1.00)
GFR calc non Af Amer: 42 mL/min — ABNORMAL LOW (ref >60)
GFR, EST AFRICAN AMERICAN: 48 mL/min — AB (ref >60)
Glucose, Bld: 103 mg/dL — ABNORMAL HIGH (ref 65–99)
Potassium: 4.3 mmol/L (ref 3.5–5.1)
SODIUM: 138 mmol/L (ref 135–145)

## 2016-02-10 LAB — CBC
HCT: 23.3 % — ABNORMAL LOW (ref 35.0–47.0)
HEMOGLOBIN: 7.7 g/dL — AB (ref 12.0–16.0)
MCH: 28.6 pg (ref 26.0–34.0)
MCHC: 32.9 g/dL (ref 32.0–36.0)
MCV: 87.1 fL (ref 80.0–100.0)
PLATELETS: 189 10*3/uL (ref 150–440)
RBC: 2.68 MIL/uL — AB (ref 3.80–5.20)
RDW: 14.9 % — ABNORMAL HIGH (ref 11.5–14.5)
WBC: 11.2 10*3/uL — AB (ref 3.6–11.0)

## 2016-02-10 LAB — HEMOGLOBIN AND HEMATOCRIT, BLOOD
HEMATOCRIT: 21.6 % — AB (ref 35.0–47.0)
HEMATOCRIT: 26.6 % — AB (ref 35.0–47.0)
HEMOGLOBIN: 7.2 g/dL — AB (ref 12.0–16.0)
Hemoglobin: 8.8 g/dL — ABNORMAL LOW (ref 12.0–16.0)

## 2016-02-10 LAB — PREPARE RBC (CROSSMATCH)

## 2016-02-10 LAB — ABO/RH: ABO/RH(D): O POS

## 2016-02-10 SURGERY — ESOPHAGOGASTRODUODENOSCOPY (EGD) WITH PROPOFOL
Anesthesia: General

## 2016-02-10 MED ORDER — PROPOFOL 10 MG/ML IV BOLUS
INTRAVENOUS | Status: DC | PRN
  Administered 2016-02-10: 30 mg via INTRAVENOUS
  Administered 2016-02-10: 10 mg via INTRAVENOUS

## 2016-02-10 MED ORDER — PANTOPRAZOLE SODIUM 40 MG IV SOLR
8.0000 mg/h | INTRAVENOUS | Status: DC
  Administered 2016-02-10: 8 mg/h via INTRAVENOUS
  Filled 2016-02-10: qty 80

## 2016-02-10 MED ORDER — SODIUM CHLORIDE 0.9 % IV SOLN
80.0000 mg | Freq: Once | INTRAVENOUS | Status: AC
  Administered 2016-02-10: 80 mg via INTRAVENOUS
  Filled 2016-02-10: qty 80

## 2016-02-10 MED ORDER — SODIUM CHLORIDE 0.9 % IV SOLN
INTRAVENOUS | Status: DC
  Administered 2016-02-10 – 2016-02-11 (×3): via INTRAVENOUS

## 2016-02-10 MED ORDER — SODIUM CHLORIDE 0.9 % IV SOLN
Freq: Once | INTRAVENOUS | Status: AC
  Administered 2016-02-10: 15:00:00 via INTRAVENOUS

## 2016-02-10 MED ORDER — PROPOFOL 500 MG/50ML IV EMUL
INTRAVENOUS | Status: DC | PRN
  Administered 2016-02-10: 150 ug/kg/min via INTRAVENOUS

## 2016-02-10 NOTE — Care Management Note (Signed)
Case Management Note  Patient Details  Name: Erin Snyder MRN: [id number] Date of Birth: 04/22/1923  Subjective/Objective:                 Patient admitted from home with GI bleed.  Patient is alert and oriented.  Blind and HOH.  History provided by daughter who is at bedside.  Lives in her home.  Her son lives there with her.  Patient does not have medical equipment other than a white cane.  Daughter states that when the patient is in her own environment she feels where she is walking and is able to perform her daily routine.  Patient has private pay care Monday thru Friday from 8-5.  [facility]  [facility] awaiting return call to determine active services.  Patient obtains medications from mail scripts.  RNCM following    Action/Plan:   Expected Discharge Date:                  Expected Discharge Plan:     In-House Referral:     Discharge planning Services     Post Acute Care Choice:    Choice offered to:     DME Arranged:    DME Agency:     HH Arranged:    HH Agency:     Status of Service:     Medicare Important Message Given:    Date Medicare IM Given:    Medicare IM give by:    Date Additional Medicare IM Given:    Additional Medicare Important Message give by:     If discussed at Long Length of Stay Meetings, dates discussed:    Additional Comments:  Chapman FitchBOWEN, Erin Losh T, RN 02/10/2016, 11:04 AM

## 2016-02-10 NOTE — Transfer of Care (Signed)
Immediate Anesthesia Transfer of Care Note  Patient: Erin Snyder  Procedure(s) Performed: Procedure(s): ESOPHAGOGASTRODUODENOSCOPY (EGD) WITH PROPOFOL (N/A)  Patient Location: PACU  Anesthesia Type:General  Level of Consciousness: sedated  Airway & Oxygen Therapy: Patient Spontanous Breathing and Patient connected to face mask oxygen  Post-op Assessment: Report given to RN and Post -op Vital signs reviewed and stable  Post vital signs: Reviewed and stable  Last Vitals:  Filed Vitals:   02/10/16 1420 02/10/16 1501  BP: 117/31 97/41  Pulse: 67 75  Temp: 37.3 C 36.4 C  Resp: 20 18    Complications: No apparent anesthesia complications

## 2016-02-10 NOTE — Op Note (Signed)
[facility] Gastroenterology Patient Name: Erin Snyder Procedure Date: 02/10/2016 2:39 PM MRN: [id number] Account #: [id number] Date of Birth: 07/04/1923 Admit Type: Inpatient Age: 82 Room: [facility] END[facility] ROOM 4 Gender: Female Note Status: Finalized Procedure:            Upper GI endoscopy Indications:          Melena Providers:            Midge Miniumarren Tavoris Brisk, MD Referring MD:         Neomia Dearavid N. Harrington Challengerhies, MD (Referring MD) Medicines:            Propofol per Anesthesia Complications:        No immediate complications. Procedure:            Pre-Anesthesia Assessment:                       - Prior to the procedure, a History and Physical was                        performed, and patient medications and allergies were                        reviewed. The patient's tolerance of previous                        anesthesia was also reviewed. The risks and benefits of                        the procedure and the sedation options and risks were                        discussed with the patient. All questions were                        answered, and informed consent was obtained. Prior                        Anticoagulants: The patient has taken anticoagulant                        medication, last dose was 1 day prior to procedure. ASA                        Grade Assessment: III - A patient with severe systemic                        disease. After reviewing the risks and benefits, the                        patient was deemed in satisfactory condition to undergo                        the procedure.                       After obtaining informed consent, the endoscope was                        passed under direct vision. Throughout the procedure,  the patient's blood pressure, pulse, and oxygen                        saturations were monitored continuously. The                        Colonoscope was introduced through the mouth, and   advanced to the second part of duodenum. The upper GI                        endoscopy was accomplished without difficulty. The                        patient tolerated the procedure well. Findings:      The examined esophagus was normal.      Three non-bleeding cratered gastric ulcers with no stigmata of bleeding       were found in the gastric antrum.      The examined duodenum was normal. Impression:           - Normal esophagus.                       - Non-bleeding gastric ulcers with no stigmata of                        bleeding.                       - Normal examined duodenum.                       - No specimens collected. Recommendation:       - Avoid NSAID's and anticoagulation. Procedure Code(s):    --- Professional ---                       [phone], Esophagogastroduodenoscopy, flexible, transoral;                        diagnostic, including collection of specimen(s) by                        brushing or washing, when performed (separate procedure) Diagnosis Code(s):    --- Professional ---                       K92.1, Melena (includes Hematochezia)                       K25.9, Gastric ulcer, unspecified as acute or chronic,                        without hemorrhage or perforation CPT copyright 2016 American Medical Association. All rights reserved. The codes documented in this report are preliminary and upon coder review may  be revised to meet current compliance requirements. Midge Minium, MD 02/10/2016 2:53:55 PM This report has been signed electronically. Number of Addenda: 0 Note Initiated On: 02/10/2016 2:39 PM      [facility]

## 2016-02-10 NOTE — Progress Notes (Signed)
Pt tolerated clear liquids well. Pt ate all of jello and italian ice.  Pt blood transfusion infusing. Attempted new IV site x2. Passed on shift report that additional site is needed. Continue to assess.

## 2016-02-10 NOTE — Progress Notes (Signed)
[facility] [place] at [facility]   PATIENT NAME: Shakia Sebastiano    MR#:  [id number]  DATE OF BIRTH:  [birth year]  SUBJECTIVE: Admitted for GI bleed. Patient is blind and also hard of hearing. Brought in because of bloody diarrhea. Started on IV hydration, admitted to floor. No bowel movement since admission. Wants to eat. Hemoglobin dropped him 9.3 9.3  To 7.7.   CHIEF COMPLAINT:   Chief Complaint  Patient presents with  . Diarrhea    sudden onset after eating    REVIEW OF SYSTEMS:   ROS CONSTITUTIONAL: No fever, fatigue or weakness.  EYES: No blurred or double vision.  EARS, NOSE, AND THROAT: No tinnitus or ear pain.  RESPIRATORY: No cough, shortness of breath, wheezing or hemoptysis.  CARDIOVASCULAR: No chest pain, orthopnea, edema.  GASTROINTESTINAL: No nausea, vomiting, diarrhea or abdominal pain.  GENITOURINARY: No dysuria, hematuria.  ENDOCRINE: No polyuria, nocturia,  HEMATOLOGY: No anemia, easy bruising or bleeding SKIN: No rash or lesion. MUSCULOSKELETAL: No joint pain or arthritis.   NEUROLOGIC: No tingling, numbness, weakness.  PSYCHIATRY: No anxiety or depression.   DRUG ALLERGIES:  No Known Allergies  VITALS:  Blood pressure 111/68, pulse 85, temperature 98.6 F (37 C), temperature source Oral, resp. rate 20, height  (1.626 m), weight 72.303 kg (159 lb 6.4 oz), SpO2 98 %.  PHYSICAL EXAMINATION:  GENERAL:  82 y.o.-year-old patient lying in the bed with no acute distress.  EYES: Pupils equal, round, reactive to light and accommodation. No scleral icterus. Extraocular muscles intact.  HEENT: Head atraumatic, normocephalic. Oropharynx and nasopharynx clear.  NECK:  Supple, no jugular venous distention. No thyroid enlargement, no tenderness.  LUNGS: Normal breath sounds bilaterally, no wheezing, rales,rhonchi or crepitation. No use of accessory muscles of respiration.  CARDIOVASCULAR: S1, S2 normal. No murmurs, rubs, or gallops.   ABDOMEN: Soft, nontender, nondistended. Bowel sounds present. No organomegaly or mass.  EXTREMITIES: No pedal edema, cyanosis, or clubbing.  NEUROLOGIC: Cranial nerves II through XII are intact. Muscle strength 5/5 in all extremities. Sensation intact. Gait not checked.  PSYCHIATRIC: The patient is alert and oriented x 3.  SKIN: No obvious rash, lesion, or ulcer.    LABORATORY PANEL:   CBC  Recent Labs Lab 02/10/16 0406  WBC 11.2*  HGB 7.7*  HCT 23.3*  PLT 189   ------------------------------------------------------------------------------------------------------------------  Chemistries   Recent Labs Lab 02/10/16 0406  NA 138  K 4.3  CL 109  CO2 24  GLUCOSE 103*  BUN 47*  CREATININE 1.12*  CALCIUM 8.8*   ------------------------------------------------------------------------------------------------------------------  Cardiac Enzymes  Recent Labs Lab 02/09/16 1918  TROPONINI <0.03   ------------------------------------------------------------------------------------------------------------------  RADIOLOGY:  Ct Angio Abd/pel W/ And/or W/o  02/09/2016  CLINICAL DATA:  82 year old female with back pain EXAM: CTA ABDOMEN AND PELVIS wITHOUT AND WITH CONTRAST TECHNIQUE: Multidetector CT imaging of the abdomen and pelvis was performed using the standard protocol during bolus administration of intravenous contrast. Multiplanar reconstructed images and MIPs were obtained and reviewed to evaluate the vascular anatomy. CONTRAST:  75 cc Isovue 370 COMPARISON:  Lumbar spine MRI dated 02/12/2014 FINDINGS: There is emphysematous changes of the lung bases. No intra-abdominal free air or free fluid. Cholecystectomy. There is a 2.2 cm stone in the uncinate process of the pancreas or within the central CBD at the head of the pancreas. There is dilatation central CBD measuring up to 14 mm. A 4 mm stone is noted in the head of the pancreas above the larger calculus.  There is diffuse  dilatation of the main pancreatic duct with gland atrophy. There is a 3.1 x 4.1 cm hypodensity in the head and uncinate process of the pancreas. This likely represents a hypodense lesion in the head of the pancreas and less likely related to dilated pancreatic ducts. Further evaluation with MRI and MRCP recommended. The spleen and adrenal glands are grossly unremarkable. Bilateral renal hypodense lesions measuring up to 3.2 cm in the upper pole of the right kidney are not well characterized. The larger lesion in the upper pole of the right kidney likely represents a cyst. An ill-defined 1.5 cm hypodense lesion in the interpolar aspect of the left kidney is not well characterized on this exam. MRI may provide better characterization. There is no hydronephrosis on either side. The visualized ureters and urinary bladder appear unremarkable. The uterus is anteverted. Small focus of calcification within the uterus likely related to a calcified fibroid. The visualized ovaries are grossly unremarkable. There is sigmoid diverticulosis without active inflammatory changes. There is no evidence of bowel obstruction or active inflammation. Normal appendix. There is aortoiliac atherosclerotic disease. There is no dissection or aneurysmal dilatation of the abdominal aorta. There is atherosclerotic calcification of the origins of the celiac axis, SMA, and the origins of the renal arteries. The origins of the celiac axis, SMA, IMA as well as the origins of the renal arteries however remain patent. 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 The splenic vein, main portal vein, and SMV appear patent. No portal venous gas identified. There is apparent thickening of the left femoral pains with luminal narrowing. This may be related to timing of the contrast and mixing artifact or represent chronic thrombus/scarring. Acute thrombus is less likely but not excluded. Clinical correlation is recommended. Duplex ultrasound  of these veins in may provide better evaluation if there is clinical concern for DVT. There is no adenopathy. There is a small fat containing umbilical hernia. A ventral hernia repair mesh may be present. The abdominal wall soft tissues are otherwise unremarkable. There is osteopenia with degenerative changes of the spine and grade 1 L5-S1 anterolisthesis. Multilevel disc desiccation with vacuum phenomena. No acute fracture. Old healed left rib fractures partially visualized. Review of the MIP images confirms the above findings. IMPRESSION: No CT evidence of abdominal aortic dissection or aneurysm. 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 Mixing artifact versus a chronic thrombus/scarring in the left femoral venous system. Acute thrombus is less likely. Duplex ultrasound may provide better evaluation of these veins if there is clinical concern for acute DVT. A 3.1 x 4.1 cm hypodense lesion at the head of the pancreas with associated gland atrophy with dilatation of the main pancreatic duct. MRI without and with contrast is recommended for further characterization. A 2.2 cm calcific density in the uncinate process of the pancreas may represent focal calcification of the pancreatic lesion or represent a stone within the central CBD. This can be further evaluated on the MRI with additional MRCP images. Sigmoid diverticulosis with no evidence of bowel obstruction or active inflammation. Electronically Signed   By: Elgie Collard M.D.   On: 02/09/2016 21:22    EKG:   Orders placed or performed during the hospital encounter of 02/09/16  . ED EKG  . ED EKG    ASSESSMENT AND PLAN:   #1"GI bleed: Likely upper GI bleed had black stool at home. Right now on IV hydration, stop her the liquids, continue PPIs, appreciate GI consult. #2 acute  anemia blood loss: Patient hemoglobin dropped to 9.8-7.7. Discusses With the daughter about the blood transfusion,  agreeable ,patient will get 1 unit of  PRBC  transfusion #3 . Essential hypertension: Controlled #4 hypothyroidism; continue Synthroid 5. Acute DVT of the left  ZOX:WRUELeg:hold Eliquis.  D/w daughter   All the records are reviewed and case discussed with Care Management/Social Workerr. Management plans discussed with the patient, family and they are in agreement.  CODE STATUS: DNR  TOTAL TIME TAKING CARE OF THIS PATIENT: 35minutes.   POSSIBLE D/C IN 1-2DAYS, DEPENDING ON CLINICAL CONDITION.   Katha HammingKONIDENA,Lazlo Tunney M.D on 02/10/2016 at 12:21 PM  Between 7am to 6pm - Pager - [phone]  After 6pm go to [email or web address] - password [email or web address] [facility]  [place] [facility]  Office  [phone]  CC: Primary care physician; Mickey FarberHIES, DAVID, MD   Note: This dictation was prepared with Dragon dictation along with smaller phrase technology. Any transcriptional errors that result from this process are unintentional.

## 2016-02-10 NOTE — Anesthesia Preprocedure Evaluation (Addendum)
Anesthesia Evaluation  Patient identified by MRN, date of birth, ID band Patient awake    Reviewed: Allergy & Precautions, H&P , NPO status , Patient's Chart, lab work & pertinent test results, reviewed documented beta blocker date and time   History of Anesthesia Complications Negative for: history of anesthetic complications  Airway Mallampati: III  TM Distance: >3 FB Neck ROM: limited    Dental no notable dental hx. (+) Edentulous Upper, Edentulous Lower, Upper Dentures, Lower Dentures   Pulmonary neg pulmonary ROS,    Pulmonary exam normal breath sounds clear to auscultation       Cardiovascular Exercise Tolerance: Good hypertension, (-) angina+CHF  (-) CAD, (-) Past MI, (-) Cardiac Stents and (-) CABG Normal cardiovascular exam(-) dysrhythmias (-) Valvular Problems/Murmurs Rhythm:regular Rate:Normal     Neuro/Psych negative neurological ROS  negative psych ROS   GI/Hepatic negative GI ROS, Neg liver ROS,   Endo/Other  neg diabetesHypothyroidism   Renal/GU CRFRenal disease  negative genitourinary   Musculoskeletal   Abdominal   Peds  Hematology negative hematology ROS (+)   Anesthesia Other Findings Past Medical History:   Hypertension                                                 CHF (congestive heart failure) (HCC)                         Hypothyroidism                                               CKD (chronic kidney disease), stage III                      HLD (hyperlipidemia)                                         Reproductive/Obstetrics negative OB ROS                             Anesthesia Physical Anesthesia Plan  ASA: III  Anesthesia Plan: General   Post-op Pain Management:    Induction:   Airway Management Planned:   Additional Equipment:   Intra-op Plan:   Post-operative Plan:   Informed Consent: I have reviewed the patients History and Physical, chart,  labs and discussed the procedure including the risks, benefits and alternatives for the proposed anesthesia with the patient or authorized representative who has indicated his/her understanding and acceptance.   Dental Advisory Given  Plan Discussed with: Anesthesiologist, CRNA and Surgeon  Anesthesia Plan Comments:         Anesthesia Quick Evaluation

## 2016-02-10 NOTE — Progress Notes (Signed)
Asked to see patient with an upper GI bleed. Was taken down to the endo department and an EGD showed multiple large antral ulcers that were not bleeding. Treat with a PPI and avoid NSAID's.

## 2016-02-10 NOTE — Anesthesia Procedure Notes (Signed)
Date/Time: 02/10/2016 2:38 PM Performed by: Stormy FabianURTIS, Denario Bagot Pre-anesthesia Checklist: Patient identified, Emergency Drugs available, Suction available and Patient being monitored Patient Re-evaluated:Patient Re-evaluated prior to inductionOxygen Delivery Method: Nasal cannula Intubation Type: IV induction Dental Injury: Teeth and Oropharynx as per pre-operative assessment  Comments: Nasal cannula with etCO2 monitoring

## 2016-02-10 NOTE — OR Nursing (Signed)
15:40 -  Patient transferred to Room 216 via Hospital Bed.  Report to Orvan JulyZaneta H., R.N.  Shon HoughJulie Minie Roadcap, RN

## 2016-02-10 NOTE — Care Management (Signed)
Patient open with Erin NorlanderGentiva RN, PT, OT, and aide through [facility]. [phone].  Should patient discharge over the weekend [phone].  Fax [phone]

## 2016-02-11 ENCOUNTER — Inpatient Hospital Stay: Payer: Medicare Other

## 2016-02-11 DIAGNOSIS — K254 Chronic or unspecified gastric ulcer with hemorrhage: Secondary | ICD-10-CM

## 2016-02-11 LAB — TYPE AND SCREEN
ABO/RH(D): O POS
ANTIBODY SCREEN: NEGATIVE
Unit division: 0

## 2016-02-11 LAB — CBC
HCT: 28.4 % — ABNORMAL LOW (ref 35.0–47.0)
HEMOGLOBIN: 9.4 g/dL — AB (ref 12.0–16.0)
MCH: 29.1 pg (ref 26.0–34.0)
MCHC: 33.1 g/dL (ref 32.0–36.0)
MCV: 87.9 fL (ref 80.0–100.0)
Platelets: 179 10*3/uL (ref 150–440)
RBC: 3.23 MIL/uL — AB (ref 3.80–5.20)
RDW: 15 % — ABNORMAL HIGH (ref 11.5–14.5)
WBC: 7.5 10*3/uL (ref 3.6–11.0)

## 2016-02-11 MED ORDER — PANTOPRAZOLE SODIUM 40 MG PO TBEC
40.0000 mg | DELAYED_RELEASE_TABLET | Freq: Two times a day (BID) | ORAL | Status: DC
  Administered 2016-02-11 – 2016-02-13 (×4): 40 mg via ORAL
  Filled 2016-02-11 (×4): qty 1

## 2016-02-11 MED ORDER — SUCRALFATE 1 GM/10ML PO SUSP
1.0000 g | Freq: Three times a day (TID) | ORAL | Status: DC
  Administered 2016-02-11 – 2016-02-13 (×9): 1 g via ORAL
  Filled 2016-02-11 (×9): qty 10

## 2016-02-11 NOTE — Progress Notes (Signed)
[facility]  [address] [place], [place] [address] Phone: [phone] Fax : [phone]   Subjective: Patient was admitted with rectal bleeding and was found to have gastric ulcers. The patient's gastric ulcers were not actively bleeding although they were large and numerous they had stopped bleeding. There is no sign of recent bleeding and the ulcers had a clean base. The patient has been tolerating clear liquid diet and the hemoglobin has increased.   Objective: Vital signs in last 24 hours: Filed Vitals:   02/10/16 1905 02/10/16 2032 02/10/16 2200 02/11/16 0613  BP: 110/43 132/54 129/55 97/68  Pulse: 81 121 69 79  Temp: 97.9 F (36.6 C) 97.8 F (36.6 C) 98.1 F (36.7 C) 97.5 F (36.4 C)  TempSrc: Oral Oral Oral Oral  Resp: 22 20 20 18   Height:      Weight:      SpO2: 100% 100% 99% 97%   Weight change:   Intake/Output Summary (Last 24 hours) at 02/11/16 0906 Last data filed at 02/11/16 0700  Gross per 24 hour  Intake   1073 ml  Output   1050 ml  Net     23 ml     Exam: Alert and orientated, no apparent distress without jaundice.   Lab Results: @LABTEST2 @ Micro Results: No results found for this or any previous visit (from the past 240 hour(s)). Studies/Results: Ct Angio Abd/pel W/ And/or W/o  02/09/2016  CLINICAL DATA:  82 year old female with back pain EXAM: CTA ABDOMEN AND PELVIS wITHOUT AND WITH CONTRAST TECHNIQUE: Multidetector CT imaging of the abdomen and pelvis was performed using the standard protocol during bolus administration of intravenous contrast. Multiplanar reconstructed images and MIPs were obtained and reviewed to evaluate the vascular anatomy. CONTRAST:  75 cc Isovue 370 COMPARISON:  Lumbar spine MRI dated 02/12/2014 FINDINGS: There is emphysematous changes of the lung bases. No intra-abdominal free air or free fluid. Cholecystectomy. There is a 2.2 cm stone in the uncinate process of the pancreas or within the central  CBD at the head of the pancreas. There is dilatation central CBD measuring up to 14 mm. A 4 mm stone is noted in the head of the pancreas above the larger calculus. There is diffuse dilatation of the main pancreatic duct with gland atrophy. There is a 3.1 x 4.1 cm hypodensity in the head and uncinate process of the pancreas. This likely represents a hypodense lesion in the head of the pancreas and less likely related to dilated pancreatic ducts. Further evaluation with MRI and MRCP recommended. The spleen and adrenal glands are grossly unremarkable. Bilateral renal hypodense lesions measuring up to 3.2 cm in the upper pole of the right kidney are not well characterized. The larger lesion in the upper pole of the right kidney likely represents a cyst. An ill-defined 1.5 cm hypodense lesion in the interpolar aspect of the left kidney is not well characterized on this exam. MRI may provide better characterization. There is no hydronephrosis on either side. The visualized ureters and urinary bladder appear unremarkable. The uterus is anteverted. Small focus of calcification within the uterus likely related to a calcified fibroid. The visualized ovaries are grossly unremarkable. There is sigmoid diverticulosis without active inflammatory changes. There is no evidence of bowel obstruction or active inflammation. Normal appendix. There is aortoiliac atherosclerotic disease. There is no dissection or aneurysmal dilatation of the abdominal aorta. There is atherosclerotic calcification of the origins of the celiac axis, SMA, and the origins of the renal arteries.  The origins of the celiac axis, SMA, IMA as well as the origins of the renal arteries however remain patent. 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 The splenic vein, main portal vein, and SMV appear patent. No portal venous gas identified. There is apparent thickening of the left femoral pains with luminal narrowing. This may be related to  timing of the contrast and mixing artifact or represent chronic thrombus/scarring. Acute thrombus is less likely but not excluded. Clinical correlation is recommended. Duplex ultrasound of these veins in may provide better evaluation if there is clinical concern for DVT. There is no adenopathy. There is a small fat containing umbilical hernia. A ventral hernia repair mesh may be present. The abdominal wall soft tissues are otherwise unremarkable. There is osteopenia with degenerative changes of the spine and grade 1 L5-S1 anterolisthesis. Multilevel disc desiccation with vacuum phenomena. No acute fracture. Old healed left rib fractures partially visualized. Review of the MIP images confirms the above findings. IMPRESSION: No CT evidence of abdominal aortic dissection or aneurysm. 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 Mixing artifact versus a chronic thrombus/scarring in the left femoral venous system. Acute thrombus is less likely. Duplex ultrasound may provide better evaluation of these veins if there is clinical concern for acute DVT. A 3.1 x 4.1 cm hypodense lesion at the head of the pancreas with associated gland atrophy with dilatation of the main pancreatic duct. MRI without and with contrast is recommended for further characterization. A 2.2 cm calcific density in the uncinate process of the pancreas may represent focal calcification of the pancreatic lesion or represent a stone within the central CBD. This can be further evaluated on the MRI with additional MRCP images. Sigmoid diverticulosis with no evidence of bowel obstruction or active inflammation. Electronically Signed   By: Elgie Collard M.D.   On: 02/09/2016 21:22   Medications: I have reviewed the patient's current medications. Scheduled Meds: . atorvastatin  20 mg Oral Daily  . citalopram  20 mg Oral Daily  . donepezil  5 mg Oral Daily  . levothyroxine  88 mcg Oral QAC breakfast  . sodium chloride flush  3 mL Intravenous  Q12H   Continuous Infusions: . sodium chloride 50 mL/hr at 02/10/16 2235  . pantoprozole (PROTONIX) infusion 8 mg/hr (02/10/16 1800)   PRN Meds:.acetaminophen **OR** acetaminophen, HYDROcodone-acetaminophen, ondansetron **OR** ondansetron (ZOFRAN) IV   Assessment: Principal Problem:   GI bleed Active Problems:   CKD (chronic kidney disease), stage III   HTN (hypertension)   HLD (hyperlipidemia)   Hypothyroidism   Deep vein thrombosis (DVT) (HCC)   Peptic ulcer of stomach    Plan: This patient had a GI bleed from multiple gastric ulcers that are now healing. The patient has not been on any NSAIDs. The patient's gastric ulcers will be treated with a PPI. The patient will not need a repeat upper endoscopy in 6 weeks to document healing because after speaking the family know intervention would be required if the lesion was malignant. The patient should have her diet advanced and does not need to be in the hospital from a GI point of view with a clean base also there is low risk of rebleeding at this time. I will sign off now please call with any questions.   LOS: 2 days   Midge Minium 02/11/2016, 9:06 AM

## 2016-02-11 NOTE — Progress Notes (Signed)
[facility] [place] [facility]   PATIENT NAME: Erin Snyder    MR#:  [id number]  DATE OF BIRTH:  05/12/1923  SUBJECTIVE:  CHIEF COMPLAINT:   Chief Complaint  Patient presents with  . Diarrhea    sudden onset after eating   - admitted with GI bleed, s/p EGD and large non bleeding ulcers noted - hb stable now. Received transfusion - left leg DVT and eliquis on hold now  REVIEW OF SYSTEMS:  Review of Systems  Constitutional: Negative for fever and chills.  HENT: Positive for hearing loss. Negative for ear discharge, ear pain and nosebleeds.   Eyes:       Legally blind  Respiratory: Negative for cough, shortness of breath and wheezing.   Cardiovascular: Positive for leg swelling. Negative for chest pain and palpitations.  Gastrointestinal: Negative for nausea, vomiting, abdominal pain, diarrhea and constipation.  Genitourinary: Negative for dysuria.  Musculoskeletal: Negative for myalgias.  Neurological: Negative for dizziness, sensory change, speech change, focal weakness, seizures and headaches.  Psychiatric/Behavioral: Negative for depression.    DRUG ALLERGIES:  No Known Allergies  VITALS:  Blood pressure 132/68, pulse 80, temperature 98 F (36.7 C), temperature source Oral, resp. rate 22, height 5\' 4"  (1.626 m), weight 72.303 kg (159 lb 6.4 oz), SpO2 98 %.  PHYSICAL EXAMINATION:  Physical Exam  GENERAL:  82 y.o.-year-old patient lying in the bed with no acute distress.  EYES: legally blind. No scleral icterus. Extraocular muscles intact.  HEENT: Head atraumatic, normocephalic. Oropharynx and nasopharynx clear.  NECK:  Supple, no jugular venous distention. No thyroid enlargement, no tenderness.  LUNGS: Normal breath sounds bilaterally, no wheezing, rales,rhonchi or crepitation. No use of accessory muscles of respiration.  CARDIOVASCULAR: S1, S2 normal. No rubs, or gallops. 3/6 systolic murmur present.  ABDOMEN: Soft, nontender,  nondistended. Bowel sounds present. No organomegaly or mass.  EXTREMITIES: No cyanosis, or clubbing. Left leg swelling NEUROLOGIC: Cranial nerves II through XII are intact. Muscle strength 5/5 in all extremities. Sensation intact. Gait not checked.  PSYCHIATRIC: The patient is alert and oriented x 3.  SKIN: No obvious rash, lesion, or ulcer.    LABORATORY PANEL:   CBC  Recent Labs Lab 02/11/16 1040  WBC 7.5  HGB 9.4*  HCT 28.4*  PLT 179   ------------------------------------------------------------------------------------------------------------------  Chemistries   Recent Labs Lab 02/10/16 0406  NA 138  K 4.3  CL 109  CO2 24  GLUCOSE 103*  BUN 47*  CREATININE 1.12*  CALCIUM 8.8*   ------------------------------------------------------------------------------------------------------------------  Cardiac Enzymes  Recent Labs Lab 02/09/16 1918  TROPONINI <0.03   ------------------------------------------------------------------------------------------------------------------  RADIOLOGY:  Ct Angio Abd/pel W/ And/or W/o  02/09/2016  CLINICAL DATA:  82 year old female with back pain EXAM: CTA ABDOMEN AND PELVIS wITHOUT AND WITH CONTRAST TECHNIQUE: Multidetector CT imaging of the abdomen and pelvis was performed using the standard protocol during bolus administration of intravenous contrast. Multiplanar reconstructed images and MIPs were obtained and reviewed to evaluate the vascular anatomy. CONTRAST:  75 cc Isovue 370 COMPARISON:  Lumbar spine MRI dated 02/12/2014 FINDINGS: There is emphysematous changes of the lung bases. No intra-abdominal free air or free fluid. Cholecystectomy. There is a 2.2 cm stone in the uncinate process of the pancreas or within the central CBD at the head of the pancreas. There is dilatation central CBD measuring up to 14 mm. A 4 mm stone is noted in the head of the pancreas above the larger calculus. There is diffuse dilatation of the main  pancreatic duct with gland atrophy. There is a 3.1 x 4.1 cm hypodensity in the head and uncinate process of the pancreas. This likely represents a hypodense lesion in the head of the pancreas and less likely related to dilated pancreatic ducts. Further evaluation with MRI and MRCP recommended. The spleen and adrenal glands are grossly unremarkable. Bilateral renal hypodense lesions measuring up to 3.2 cm in the upper pole of the right kidney are not well characterized. The larger lesion in the upper pole of the right kidney likely represents a cyst. An ill-defined 1.5 cm hypodense lesion in the interpolar aspect of the left kidney is not well characterized on this exam. MRI may provide better characterization. There is no hydronephrosis on either side. The visualized ureters and urinary bladder appear unremarkable. The uterus is anteverted. Small focus of calcification within the uterus likely related to a calcified fibroid. The visualized ovaries are grossly unremarkable. There is sigmoid diverticulosis without active inflammatory changes. There is no evidence of bowel obstruction or active inflammation. Normal appendix. There is aortoiliac atherosclerotic disease. There is no dissection or aneurysmal dilatation of the abdominal aorta. There is atherosclerotic calcification of the origins of the celiac axis, SMA, and the origins of the renal arteries. The origins of the celiac axis, SMA, IMA as well as the origins of the renal arteries however remain patent. 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 The splenic vein, main portal vein, and SMV appear patent. No portal venous gas identified. There is apparent thickening of the left femoral pains with luminal narrowing. This may be related to timing of the contrast and mixing artifact or represent chronic thrombus/scarring. Acute thrombus is less likely but not excluded. Clinical correlation is recommended. Duplex ultrasound of these veins in may  provide better evaluation if there is clinical concern for DVT. There is no adenopathy. There is a small fat containing umbilical hernia. A ventral hernia repair mesh may be present. The abdominal wall soft tissues are otherwise unremarkable. There is osteopenia with degenerative changes of the spine and grade 1 L5-S1 anterolisthesis. Multilevel disc desiccation with vacuum phenomena. No acute fracture. Old healed left rib fractures partially visualized. Review of the MIP images confirms the above findings. IMPRESSION: No CT evidence of abdominal aortic dissection or aneurysm. 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 Mixing artifact versus a chronic thrombus/scarring in the left femoral venous system. Acute thrombus is less likely. Duplex ultrasound may provide better evaluation of these veins if there is clinical concern for acute DVT. A 3.1 x 4.1 cm hypodense lesion at the head of the pancreas with associated gland atrophy with dilatation of the main pancreatic duct. MRI without and with contrast is recommended for further characterization. A 2.2 cm calcific density in the uncinate process of the pancreas may represent focal calcification of the pancreatic lesion or represent a stone within the central CBD. This can be further evaluated on the MRI with additional MRCP images. Sigmoid diverticulosis with no evidence of bowel obstruction or active inflammation. Electronically Signed   By: Elgie Collard M.D.   On: 02/09/2016 21:22    EKG:   Orders placed or performed during the hospital encounter of 02/09/16  . ED EKG  . ED EKG    ASSESSMENT AND PLAN:   82 year old female with past medical history significant for hypertension, CK D, recent DVT on eliquis, CHF admitted with GI bleed.  #1 GI bleed-eliquis is on hold. -Appreciate GI consult. Received transfusion and hemoglobin stable  at 9 -Status post EGD showing nonbleeding large healing ulcers. -Diet advanced. Continue Protonix, change  to oral. Started on Carafate  #2 recent DVT-still swelling of left leg present. -Eliquis is on hold due to GI bleed -Vascular consult for possible IVC filter placement. Lower extremity Dopplers ordered  #3 CK D-stable at this time.  #4 hypothyroidism-continue Synthroid  Physical therapy consulted DVT prophylaxis-Ted's and SCDs\  Updated family at bedside.   All the records are reviewed and case discussed with Care Management/Social Workerr. Management plans discussed with the patient, family and they are in agreement.  CODE STATUS: DNR  TOTAL TIME TAKING CARE OF THIS PATIENT: 37 minutes.   POSSIBLE D/C IN 2 DAYS, DEPENDING ON CLINICAL CONDITION.   Seattle Dalporto M.D on 02/11/2016 at 1:14 PM  Between 7am to 6pm - Pager - [phone]  After 6pm go to [email or web address] - password [email or web address] [facility]  [facility]  Office  [phone]  CC: Primary care physician; Mickey Farber, MD

## 2016-02-11 NOTE — Plan of Care (Signed)
Problem: Education: Goal: Knowledge of [facility] Education information/materials will improve Outcome: Not Progressing Patient is hard of hearing and legally blind.  Family assistance is needed.  Problem: Health Behavior/Discharge Planning: Goal: Ability to manage health-related needs will improve Outcome: Not Progressing Patient is hard of hearing and legally blind.  Family assistance is needed.

## 2016-02-12 LAB — CBC
HCT: 29.4 % — ABNORMAL LOW (ref 35.0–47.0)
HEMOGLOBIN: 10 g/dL — AB (ref 12.0–16.0)
MCH: 30 pg (ref 26.0–34.0)
MCHC: 33.8 g/dL (ref 32.0–36.0)
MCV: 88.6 fL (ref 80.0–100.0)
Platelets: 188 10*3/uL (ref 150–440)
RBC: 3.32 MIL/uL — AB (ref 3.80–5.20)
RDW: 15.1 % — ABNORMAL HIGH (ref 11.5–14.5)
WBC: 8.2 10*3/uL (ref 3.6–11.0)

## 2016-02-12 LAB — URINALYSIS COMPLETE WITH MICROSCOPIC (ARMC ONLY)
Bilirubin Urine: NEGATIVE
Glucose, UA: NEGATIVE mg/dL
Ketones, ur: NEGATIVE mg/dL
Nitrite: NEGATIVE
PH: 7 (ref 5.0–8.0)
PROTEIN: NEGATIVE mg/dL
SQUAMOUS EPITHELIAL / LPF: NONE SEEN
Specific Gravity, Urine: 1.005 (ref 1.005–1.030)

## 2016-02-12 LAB — GLUCOSE, CAPILLARY: Glucose-Capillary: 81 mg/dL (ref 65–99)

## 2016-02-12 MED ORDER — DEXTROSE 5 % IV SOLN
1.0000 g | INTRAVENOUS | Status: DC
  Administered 2016-02-12 – 2016-02-13 (×2): 1 g via INTRAVENOUS
  Filled 2016-02-12 (×2): qty 10

## 2016-02-12 MED ORDER — PRIMIDONE 50 MG PO TABS
50.0000 mg | ORAL_TABLET | Freq: Two times a day (BID) | ORAL | Status: DC
  Administered 2016-02-12 – 2016-02-13 (×3): 50 mg via ORAL
  Filled 2016-02-12 (×5): qty 1

## 2016-02-12 NOTE — Progress Notes (Signed)
ANTIBIOTIC CONSULT NOTE - INITIAL  Pharmacy Consult for Ceftriaxone  Indication: UTI  No Known Allergies  Patient Measurements: Height: 5\' 4"  (162.6 cm) Weight: 159 lb 6.4 oz (72.303 kg) IBW/kg (Calculated) : 54.7 Adjusted Body Weight:   Vital Signs: Temp: 97.7 F (36.5 C) (05/21 0510) Temp Source: Oral (05/21 0510) BP: 146/52 mmHg (05/21 0934) Pulse Rate: 92 (05/21 0510) Intake/Output from previous day: 05/20 0701 - 05/21 0700 In: 2335.5 [P.O.:1470; I.V.:865.5] Out: 3250 [Urine:3250] Intake/Output from this shift: Total I/O In: 424.2 [P.O.:240; I.V.:184.2] Out: 950 [Urine:950]  Labs:  Recent Labs  02/09/16 1919 02/10/16 0406  02/10/16 2320 02/11/16 1040 02/12/16 0548  WBC 12.7* 11.2*  --   --  7.5 8.2  HGB 9.3* 7.7*  < > 8.8* 9.4* 10.0*  PLT 245 189  --   --  179 188  CREATININE 1.16* 1.12*  --   --   --   --   < > = values in this interval not displayed. Estimated Creatinine Clearance: 31.9 mL/min (by C-G formula based on Cr of 1.12). No results for input(s): VANCOTROUGH, VANCOPEAK, VANCORANDOM, GENTTROUGH, GENTPEAK, GENTRANDOM, TOBRATROUGH, TOBRAPEAK, TOBRARND, AMIKACINPEAK, AMIKACINTROU, AMIKACIN in the last 72 hours.   Microbiology: No results found for this or any previous visit (from the past 720 hour(s)).  Medical History: Past Medical History  Diagnosis Date  . Hypertension   . CHF (congestive heart failure) (HCC)   . Hypothyroidism   . CKD (chronic kidney disease), stage III   . HLD (hyperlipidemia)     Medications:  Scheduled:  . atorvastatin  20 mg Oral Daily  . cefTRIAXone (ROCEPHIN)  IV  1 g Intravenous Q24H  . citalopram  20 mg Oral Daily  . donepezil  5 mg Oral Daily  . levothyroxine  88 mcg Oral QAC breakfast  . pantoprazole  40 mg Oral BID AC  . primidone  50 mg Oral BID  . sodium chloride flush  3 mL Intravenous Q12H  . sucralfate  1 g Oral TID WC & HS   Assessment: Pharmacy consulted to dose ceftriaxone in this 82 year old  female admitted with UTI.   CrCl = 31.9 ml/min Urine Cx pending   Goal of Therapy:  resolution of infection   Plan:  Expected duration 7 days with resolution of temperature and/or normalization of WBC   Ceftriaxone 1 gm IV Q24H ordered to start 5/21.  Torian Thoennes D 02/12/2016,10:30 AM

## 2016-02-12 NOTE — Progress Notes (Signed)
Physical Therapy Evaluation Patient Details Name: Erin Snyder MRN: [id number] DOB: 03/16/1923 Today's Date: 02/12/2016   History of Present Illness  Erin Snyder is a 82 y.o. female who presents with an episode of what she describes as "explosive" diarrhea. On evaluation in the ED she is found to have Hgb 9, down from her normal level at 12. She has recently started taking eliquis to treat left lower extremity DVT.  PT being treated for gastric ulcers and UTI.  Clinical Impression  Pt presents to PT with generalized weakness and difficulty walking and would benefit from acute PT services to address objective findings.  Pt lives with son and has a care giver during the day.  Pt able to ambulate around home without device in familiar surroundings.  In hospital setting pt requiring Min A for ambulation short distances (30 feet).  Pt c/o fatigue and weakness at end of session.  Pt is blind and HOH.    Follow Up Recommendations Home health PT    Equipment Recommendations  None recommended by PT    Recommendations for Other Services       Precautions / Restrictions Precautions Precautions: Fall Precaution Comments: MOD Restrictions Weight Bearing Restrictions: No      Mobility  Bed Mobility Overal bed mobility: Needs Assistance Bed Mobility: Supine to Sit;Sit to Supine     Supine to sit: Min guard Sit to supine: Min guard   General bed mobility comments: HOB flat, using L bed rail; extra effort, time to elevate trunk  Transfers Overall transfer level: Needs assistance Equipment used: 1 person hand held assist Transfers: Sit to/from Stand Sit to Stand: Min guard         General transfer comment: extra time for rise from bed, good body mechanics  Ambulation/Gait Ambulation/Gait assistance: Min assist Ambulation Distance (Feet): 30 Feet Assistive device: 1 person hand held assist (wall rail) Gait Pattern/deviations: Step-to pattern;Decreased step length -  right;Decreased step length - left Gait velocity: diminished Gait velocity interpretation: Below normal speed for age/gender General Gait Details: Guarded gait due to blindness, attempted with RW, but pt with decreased confidence and able to give improved tactile cues with HHA.  Stairs            Wheelchair Mobility    Modified Rankin (Stroke Patients Only)       Balance Overall balance assessment: Needs assistance         Standing balance support: Bilateral upper extremity supported;During functional activity Standing balance-Leahy Scale: Fair                               Pertinent Vitals/Pain Pain Assessment: No/denies pain    Home Living Family/patient expects to be discharged to:: Private residence Living Arrangements: Children (son) Available Help at Discharge: Personal care attendant (daytime) Type of Home: House Home Access: Level entry     Home Layout: One level        Prior Function Level of Independence: Needs assistance   Gait / Transfers Assistance Needed: amb in home using furniture, outside uses cane  ADL's / Homemaking Assistance Needed: gets dressed by herself; aide for meals and socialization        Hand Dominance        Extremity/Trunk Assessment   Upper Extremity Assessment: Generalized weakness           Lower Extremity Assessment: Generalized weakness         Communication  Communication: HOH;Other (comment) (blind)  Cognition Arousal/Alertness: Awake/alert Behavior During Therapy: WFL for tasks assessed/performed Overall Cognitive Status: Within Functional Limits for tasks assessed                      General Comments      Exercises        Assessment/Plan    PT Assessment Patient needs continued PT services  PT Diagnosis Difficulty walking;Generalized weakness   PT Problem List Decreased strength;Decreased activity tolerance;Decreased balance;Decreased knowledge of use of DME  PT  Treatment Interventions Gait training;Functional mobility training;Therapeutic activities;Therapeutic exercise;Balance training;Patient/family education   PT Goals (Current goals can be found in the Care Plan section) Acute Rehab PT Goals Patient Stated Goal: To get stronger. PT Goal Formulation: With patient Time For Goal Achievement: 02/19/16 Potential to Achieve Goals: Good    Frequency Min 2X/week   Barriers to discharge        Co-evaluation               End of Session Equipment Utilized During Treatment: Gait belt Activity Tolerance: Patient tolerated treatment well;Patient limited by fatigue Patient left: in bed;with call bell/phone within reach;with bed alarm set;with family/visitor present Nurse Communication: Mobility status         Time: 1400-1426 PT Time Calculation (min) (ACUTE ONLY): 26 min   Charges:   PT Evaluation $PT Eval Low Complexity: 1 Procedure     PT G Codes:        Melburn Treiber A Muhammed Teutsch, PT 02/12/2016, 2:36 PM

## 2016-02-12 NOTE — Progress Notes (Signed)
[facility] - [place] [facility]   PATIENT NAME: Erin Snyder    MR#:  [id number]  DATE OF BIRTH:  04/08/1923  SUBJECTIVE:  CHIEF COMPLAINT:   Chief Complaint  Patient presents with  . Diarrhea    sudden onset after eating   - Hb stable, tolerating soft diet well - episode of dizziness last night and also increased frequency of urination  REVIEW OF SYSTEMS:  Review of Systems  Constitutional: Negative for fever and chills.  HENT: Positive for hearing loss. Negative for ear discharge, ear pain and nosebleeds.   Eyes:       Legally blind  Respiratory: Negative for cough, shortness of breath and wheezing.   Cardiovascular: Positive for leg swelling. Negative for chest pain and palpitations.  Gastrointestinal: Negative for nausea, vomiting, abdominal pain, diarrhea and constipation.  Genitourinary: Positive for frequency. Negative for dysuria.  Musculoskeletal: Negative for myalgias.  Neurological: Positive for dizziness. Negative for sensory change, speech change, focal weakness, seizures and headaches.  Psychiatric/Behavioral: Negative for depression.    DRUG ALLERGIES:  No Known Allergies  VITALS:  Blood pressure 146/52, pulse 92, temperature 97.7 F (36.5 C), temperature source Oral, resp. rate 18, height 5\' 4"  (1.626 m), weight 72.303 kg (159 lb 6.4 oz), SpO2 100 %.  PHYSICAL EXAMINATION:  Physical Exam  GENERAL:  82 y.o.-year-old patient lying in the bed with no acute distress.  EYES: legally blind. No scleral icterus. Extraocular muscles intact.  HEENT: Head atraumatic, normocephalic. Oropharynx and nasopharynx clear.  NECK:  Supple, no jugular venous distention. No thyroid enlargement, no tenderness.  LUNGS: Normal breath sounds bilaterally, no wheezing, rales,rhonchi or crepitation. No use of accessory muscles of respiration.  CARDIOVASCULAR: S1, S2 normal. No rubs, or gallops. 3/6 systolic murmur present.  ABDOMEN: Soft, nontender,  nondistended. Bowel sounds present. No organomegaly or mass.  EXTREMITIES: No cyanosis, or clubbing. Left leg swelling NEUROLOGIC: Cranial nerves II through XII are intact. Muscle strength 5/5 in all extremities. Sensation intact. Gait not checked.  PSYCHIATRIC: The patient is alert and oriented x 3.  SKIN: No obvious rash, lesion, or ulcer.    LABORATORY PANEL:   CBC  Recent Labs Lab 02/12/16 0548  WBC 8.2  HGB 10.0*  HCT 29.4*  PLT 188   ------------------------------------------------------------------------------------------------------------------  Chemistries   Recent Labs Lab 02/10/16 0406  NA 138  K 4.3  CL 109  CO2 24  GLUCOSE 103*  BUN 47*  CREATININE 1.12*  CALCIUM 8.8*   ------------------------------------------------------------------------------------------------------------------  Cardiac Enzymes  Recent Labs Lab 02/09/16 1918  TROPONINI <0.03   ------------------------------------------------------------------------------------------------------------------  RADIOLOGY:  [place] Venous Img Lower Bilateral  02/11/2016  CLINICAL DATA:  82 year old with personal history of left lower extremity DVT, presenting with bilateral lower extremity edema. EXAM: BILATERAL LOWER EXTREMITY VENOUS DOPPLER ULTRASOUND TECHNIQUE: Gray-scale sonography with graded compression, as well as color Doppler and duplex ultrasound were performed to evaluate the lower extremity deep venous systems from the level of the common femoral vein and including the common femoral, femoral, profunda femoral, popliteal and calf veins including the posterior tibial, peroneal and gastrocnemius veins when visible. The superficial great saphenous vein was also interrogated. Spectral Doppler was utilized to evaluate flow at rest and with distal augmentation maneuvers in the common femoral, femoral and popliteal veins. COMPARISON:  None. FINDINGS: RIGHT LOWER EXTREMITY Common Femoral Vein: No evidence  of thrombus. Normal compressibility, respiratory phasicity and response to augmentation. Saphenofemoral Junction: No evidence of thrombus. Normal compressibility and flow on color Doppler  imaging. Profunda Femoral Vein: No evidence of thrombus. Normal compressibility and flow on color Doppler imaging. Femoral Vein: No evidence of thrombus. Normal compressibility, respiratory phasicity and response to augmentation. Popliteal Vein: No evidence of thrombus. Normal compressibility, respiratory phasicity and response to augmentation. Calf Veins: No evidence of thrombus. Normal compressibility and flow on color Doppler imaging. Venous Reflux:  Not evaluated. Other Findings:  None. LEFT LOWER EXTREMITY Common Femoral Vein: No evidence of thrombus. Normal compressibility, respiratory phasicity and response to augmentation. Saphenofemoral Junction: No evidence of thrombus. Normal compressibility and flow on color Doppler imaging. Profunda Femoral Vein: No evidence of thrombus. Normal compressibility and flow on color Doppler imaging. Femoral Vein: No evidence of thrombus. Normal compressibility, respiratory phasicity and response to augmentation. Popliteal Vein: No evidence of thrombus. Normal compressibility, respiratory phasicity and response to augmentation. Calf Veins: No evidence of thrombus. Normal compressibility and flow on color Doppler imaging. Venous Reflux:  Not evaluated. Other Findings:  None. IMPRESSION: No evidence of DVT involving either the right or left lower extremity. Electronically Signed   By: Hulan Saas M.D.   On: 02/11/2016 18:15    EKG:   Orders placed or performed during the hospital encounter of 02/09/16  . ED EKG  . ED EKG    ASSESSMENT AND PLAN:   82 year old female with past medical history significant for hypertension, CK D, recent DVT on eliquis, CHF admitted with GI bleed.  #1 GI bleed-eliquis is on hold. -Appreciate GI consult. Received transfusion and hemoglobin stable  around 9, today at 10 -Status post EGD showing nonbleeding large healing ulcers. -Diet advanced. Continue Protonix, changedto oral. Started on Carafate  #2 recent DVT-still swelling of left leg present. -Eliquis is on hold due to GI bleed -Vascular consult for possible IVC filter placement evaluation - lower extremity dopplers negative for DVT now- start compression stockings and SCDs  #3 Dizziness- check orthostatics, also UA to r/o UTI with increased frequency of urination  #4 hypothyroidism-continue Synthroid  Physical therapy consulted DVT prophylaxis-Ted's and SCDs  Updated son at bedside.   All the records are reviewed and case discussed with Care Management/Social Workerr. Management plans discussed with the patient, family and they are in agreement.  CODE STATUS: DNR  TOTAL TIME TAKING CARE OF THIS PATIENT: 37 minutes.   POSSIBLE D/C IN 1-2 DAYS, DEPENDING ON CLINICAL CONDITION.   Enid Baas M.D on 02/12/2016 at 10:09 AM  Between 7am to 6pm - Pager - [phone]  After 6pm go to [email or web address] - password [email or web address] [facility]  [place] [facility]  Office  [phone]  CC: Primary care physician; Mickey Farber, MD

## 2016-02-13 ENCOUNTER — Encounter
Admission: EM | Disposition: A | Discharge status: Home / Self Care | Payer: Self-pay | Source: Home / Self Care | Attending: Internal Medicine

## 2016-02-13 LAB — BASIC METABOLIC PANEL
ANION GAP: 2 — AB (ref 5–15)
BUN: 12 mg/dL (ref 6–20)
CALCIUM: 8 mg/dL — AB (ref 8.9–10.3)
CO2: 27 mmol/L (ref 22–32)
CREATININE: 0.72 mg/dL (ref 0.44–1.00)
Chloride: 108 mmol/L (ref 101–111)
Glucose, Bld: 90 mg/dL (ref 65–99)
Potassium: 2.9 mmol/L — CL (ref 3.5–5.1)
SODIUM: 137 mmol/L (ref 135–145)

## 2016-02-13 LAB — URINE CULTURE

## 2016-02-13 LAB — POTASSIUM: POTASSIUM: 3.6 mmol/L (ref 3.5–5.1)

## 2016-02-13 SURGERY — IVC FILTER INSERTION
Anesthesia: Moderate Sedation

## 2016-02-13 MED ORDER — POTASSIUM CHLORIDE CRYS ER 20 MEQ PO TBCR: 40.0000 meq | EXTENDED_RELEASE_TABLET | Freq: Every day | ORAL | Status: AC

## 2016-02-13 MED ORDER — FUROSEMIDE 20 MG PO TABS
20.0000 mg | ORAL_TABLET | ORAL | Status: AC
Start: 2016-02-13 — End: ?

## 2016-02-13 MED ORDER — POTASSIUM CHLORIDE 10 MEQ/100ML IV SOLN
10.0000 meq | INTRAVENOUS | Status: DC
  Administered 2016-02-13 (×2): 10 meq via INTRAVENOUS
  Filled 2016-02-13 (×4): qty 100

## 2016-02-13 MED ORDER — PANTOPRAZOLE SODIUM 40 MG PO TBEC: 40.0000 mg | DELAYED_RELEASE_TABLET | Freq: Two times a day (BID) | ORAL | Status: AC

## 2016-02-13 MED ORDER — ASPIRIN EC 81 MG PO TBEC: 81.0000 mg | DELAYED_RELEASE_TABLET | Freq: Every day | ORAL | Status: AC

## 2016-02-13 MED ORDER — POTASSIUM CHLORIDE CRYS ER 20 MEQ PO TBCR: 40.0000 meq | EXTENDED_RELEASE_TABLET | ORAL | Status: DC

## 2016-02-13 MED ORDER — POTASSIUM CHLORIDE CRYS ER 20 MEQ PO TBCR
40.0000 meq | EXTENDED_RELEASE_TABLET | Freq: Once | ORAL | Status: AC
  Administered 2016-02-13: 40 meq via ORAL
  Filled 2016-02-13: qty 2

## 2016-02-13 MED ORDER — CEPHALEXIN 250 MG PO CAPS: 250.0000 mg | ORAL_CAPSULE | Freq: Three times a day (TID) | ORAL | Status: DC

## 2016-02-13 MED ORDER — SUCRALFATE 1 GM/10ML PO SUSP: 1.0000 g | Freq: Three times a day (TID) | ORAL | Status: DC

## 2016-02-13 NOTE — Care Management (Addendum)
[facility] updated on patient's status. Resumption of home health needed at discharge. Patient is followed by RN, PT, OT, and HHA.

## 2016-02-13 NOTE — Anesthesia Postprocedure Evaluation (Signed)
Anesthesia Post Note  Patient: Erin Snyder  Procedure(s) Performed: Procedure(s) (LRB): ESOPHAGOGASTRODUODENOSCOPY (EGD) WITH PROPOFOL (N/A)  Patient location during evaluation: Endoscopy Anesthesia Type: General Level of consciousness: awake and alert Pain management: pain level controlled Vital Signs Assessment: post-procedure vital signs reviewed and stable Respiratory status: spontaneous breathing, nonlabored ventilation, respiratory function stable and patient connected to nasal cannula oxygen Cardiovascular status: blood pressure returned to baseline and stable Postop Assessment: no signs of nausea or vomiting Anesthetic complications: no    Last Vitals:  Filed Vitals:   02/12/16 1522 02/12/16 2103  BP: 123/64 161/67  Pulse: 103 88  Temp:  36.7 C  Resp:  18    Last Pain:  Filed Vitals:   02/12/16 2102  PainSc: 0-No pain                 Lenard SimmerAndrew Marg Macmaster

## 2016-02-13 NOTE — Consult Note (Signed)
[facility] Vascular Consult Note  MRN : [id number]  Erin Snyder is a 82 y.o. (Apr 29, 1923) female who presents with chief complaint of  Chief Complaint  Patient presents with  . Diarrhea    sudden onset after eating  .  History of Present Illness: I am asked by Dr. Nemiah Commander to see the patient regarding IVC filter placement.  She is a 82 year old female who developed severe diarrhea suddenly after eating a few days ago. She had blood in her stools and developed melena with a drop in her hemoglobin. She was undergoing treatment for deep venous thrombosis with anticoagulation prior to this admission. Her anticoagulation has had to be stopped and she has been evaluated by gastroenterology. She provides essentially no history and this is obtained from the previous medical record or her family. A DVT study performed today did not demonstrate any evidence of acute DVT in the lower extremities.  Current Facility-Administered Medications  Medication Dose Route Frequency Provider Last Rate Last Dose  . acetaminophen (TYLENOL) tablet 650 mg  650 mg Oral Q6H PRN Oralia Manis, MD       Or  . acetaminophen (TYLENOL) suppository 650 mg  650 mg Rectal Q6H PRN Oralia Manis, MD      . atorvastatin (LIPITOR) tablet 20 mg  20 mg Oral Daily Oralia Manis, MD   20 mg at 02/13/16 1000  . cefTRIAXone (ROCEPHIN) 1 g in dextrose 5 % 50 mL IVPB  1 g Intravenous Q24H Enid Baas, MD   1 g at 02/13/16 1030  . citalopram (CELEXA) tablet 20 mg  20 mg Oral Daily Oralia Manis, MD   20 mg at 02/13/16 1000  . donepezil (ARICEPT) tablet 5 mg  5 mg Oral Daily Oralia Manis, MD   5 mg at 02/13/16 1000  . HYDROcodone-acetaminophen (NORCO/VICODIN) 5-325 MG per tablet 0.5-1 tablet  0.5-1 tablet Oral BID PRN Oralia Manis, MD      . levothyroxine (SYNTHROID, LEVOTHROID) tablet 88 mcg  88 mcg Oral QAC breakfast Oralia Manis, MD   88 mcg at 02/13/16 4098  . ondansetron (ZOFRAN) tablet 4 mg  4 mg Oral  Q6H PRN Oralia Manis, MD       Or  . ondansetron [facility]) injection 4 mg  4 mg Intravenous Q6H PRN Oralia Manis, MD      . pantoprazole (PROTONIX) EC tablet 40 mg  40 mg Oral BID AC Enid Baas, MD   40 mg at 02/13/16 1191  . primidone (MYSOLINE) tablet 50 mg  50 mg Oral BID Enid Baas, MD   50 mg at 02/13/16 1000  . sodium chloride flush (NS) 0.9 % injection 3 mL  3 mL Intravenous Q12H Oralia Manis, MD   3 mL at 02/13/16 1000  . sucralfate (CARAFATE) 1 GM/10ML suspension 1 g  1 g Oral TID WC & HS Enid Baas, MD   1 g at 02/13/16 1134    Past Medical History  Diagnosis Date  . Hypertension   . CHF (congestive heart failure) (HCC)   . Hypothyroidism   . CKD (chronic kidney disease), stage III   . HLD (hyperlipidemia)     Past Surgical History  Procedure Laterality Date  . Cholecystectomy      Social History Social History  Substance Use Topics  . Smoking status: Never Smoker   . Smokeless tobacco: None  . Alcohol Use: No  No IVDU  Family History Family History  Problem Relation Age of Onset  . Kidney  cancer    . Cerebral palsy    . Heart attack    . Stroke    no bleeding disorders, clotting disorders, or autoimmune diseases  No Known Allergies   REVIEW OF SYSTEMS (Negative unless checked)  Constitutional: [] Weight loss  [] Fever  [] Chills Cardiac: [] Chest pain   [] Chest pressure   [] Palpitations   [] Shortness of breath when laying flat   [] Shortness of breath at rest   [] Shortness of breath with exertion. Vascular:  [] Pain in legs with walking   [] Pain in legs at rest   [] Pain in legs when laying flat   [] Claudication   [] Pain in feet when walking  [] Pain in feet at rest  [] Pain in feet when laying flat   [x] History of DVT   [] Phlebitis   [] Swelling in legs   [] Varicose veins   [] Non-healing ulcers Pulmonary:   [] Uses home oxygen   [] Productive cough   [] Hemoptysis   [] Wheeze  [] COPD   [] Asthma Neurologic:  [] Dizziness  [] Blackouts   [] Seizures    [] History of stroke   [] History of TIA  [] Aphasia   [] Temporary blindness   [] Dysphagia   [] Weakness or numbness in arms   [] Weakness or numbness in legs Musculoskeletal:  [] Arthritis   [] Joint swelling   [] Joint pain   [] Low back pain Hematologic:  [] Easy bruising  [] Easy bleeding   [] Hypercoagulable state   [x] Anemic  [] Hepatitis Gastrointestinal:  [x] Blood in stool   [] Vomiting blood  [] Gastroesophageal reflux/heartburn   [] Difficulty swallowing. Genitourinary:  [] Chronic kidney disease   [] Difficult urination  [] Frequent urination  [] Burning with urination   [] Blood in urine Skin:  [] Rashes   [] Ulcers   [] Wounds Psychological:  [] History of anxiety   []  History of major depression.  Physical Examination  Filed Vitals:   02/13/16 0511 02/13/16 1347 02/13/16 1349 02/13/16 1351  BP: 137/51 141/57 145/60 132/56  Pulse: 57 74 91 75  Temp: 98.2 F (36.8 C) 98.4 F (36.9 C)    TempSrc: Oral Oral    Resp: 24 18    Height:      Weight:      SpO2: 95% 96%     Body mass index is 27.35 kg/(m^2). Gen:  Frail, elderly female lying in bed in NAD Head: Delta/AT, No temporalis wasting. Prominent temp pulse not noted. Ear/Nose/Throat: Hearing grossly intact, nares w/o erythema or drainage, oropharynx w/o Erythema/Exudate Eyes: PERRLA, EOMI.  Neck: Supple, no nuchal rigidity.  No JVD.  Pulmonary:  Good air movement, equal bilaterally.  Cardiac: RRR, normal S1, S2 Vascular:  Vessel Right Left  Radial Palpable Palpable                                   Gastrointestinal: soft, non-tender/non-distended. No guarding/reflex. No masses, surgical incisions, or scars. Musculoskeletal: Extremities without ischemic changes.  No deformity or atrophy. Mild LE edema Neurologic: difficult to assess.  No obvious focal deficits.  Visual acuity decreased. Psychiatric: poor historian, family provides history.  Seems to have some degree of dementia Dermatologic: No rashes or ulcers noted.  No cellulitis or  open wounds. Lymph : No Cervical, Axillary, or Inguinal lymphadenopathy.      CBC Lab Results  Component Value Date   WBC 8.2 02/12/2016   HGB 10.0* 02/12/2016   HCT 29.4* 02/12/2016   MCV 88.6 02/12/2016   PLT 188 02/12/2016    BMET    Component Value Date/Time  NA 137 02/13/2016 0731   K 2.9* 02/13/2016 0731   CL 108 02/13/2016 0731   CO2 27 02/13/2016 0731   GLUCOSE 90 02/13/2016 0731   BUN 12 02/13/2016 0731   CREATININE 0.72 02/13/2016 0731   CALCIUM 8.0* 02/13/2016 0731   GFRNONAA >60 02/13/2016 0731   GFRAA >60 02/13/2016 0731   Estimated Creatinine Clearance: 44.6 mL/min (by C-G formula based on Cr of 0.72).  COAG No results found for: INR, PROTIME  Radiology [place] Venous Img Lower Bilateral  02/11/2016  CLINICAL DATA:  82 year old with personal history of left lower extremity DVT, presenting with bilateral lower extremity edema. EXAM: BILATERAL LOWER EXTREMITY VENOUS DOPPLER ULTRASOUND TECHNIQUE: Gray-scale sonography with graded compression, as well as color Doppler and duplex ultrasound were performed to evaluate the lower extremity deep venous systems from the level of the common femoral vein and including the common femoral, femoral, profunda femoral, popliteal and calf veins including the posterior tibial, peroneal and gastrocnemius veins when visible. The superficial great saphenous vein was also interrogated. Spectral Doppler was utilized to evaluate flow at rest and with distal augmentation maneuvers in the common femoral, femoral and popliteal veins. COMPARISON:  None. FINDINGS: RIGHT LOWER EXTREMITY Common Femoral Vein: No evidence of thrombus. Normal compressibility, respiratory phasicity and response to augmentation. Saphenofemoral Junction: No evidence of thrombus. Normal compressibility and flow on color Doppler imaging. Profunda Femoral Vein: No evidence of thrombus. Normal compressibility and flow on color Doppler imaging. Femoral Vein: No evidence of  thrombus. Normal compressibility, respiratory phasicity and response to augmentation. Popliteal Vein: No evidence of thrombus. Normal compressibility, respiratory phasicity and response to augmentation. Calf Veins: No evidence of thrombus. Normal compressibility and flow on color Doppler imaging. Venous Reflux:  Not evaluated. Other Findings:  None. LEFT LOWER EXTREMITY Common Femoral Vein: No evidence of thrombus. Normal compressibility, respiratory phasicity and response to augmentation. Saphenofemoral Junction: No evidence of thrombus. Normal compressibility and flow on color Doppler imaging. Profunda Femoral Vein: No evidence of thrombus. Normal compressibility and flow on color Doppler imaging. Femoral Vein: No evidence of thrombus. Normal compressibility, respiratory phasicity and response to augmentation. Popliteal Vein: No evidence of thrombus. Normal compressibility, respiratory phasicity and response to augmentation. Calf Veins: No evidence of thrombus. Normal compressibility and flow on color Doppler imaging. Venous Reflux:  Not evaluated. Other Findings:  None. IMPRESSION: No evidence of DVT involving either the right or left lower extremity. Electronically Signed   By: Hulan Saashomas  Lawrence M.D.   On: 02/11/2016 18:15   Ct Angio Abd/pel W/ And/or W/o  02/09/2016  CLINICAL DATA:  82 year old female with back pain EXAM: CTA ABDOMEN AND PELVIS wITHOUT AND WITH CONTRAST TECHNIQUE: Multidetector CT imaging of the abdomen and pelvis was performed using the standard protocol during bolus administration of intravenous contrast. Multiplanar reconstructed images and MIPs were obtained and reviewed to evaluate the vascular anatomy. CONTRAST:  75 cc Isovue 370 COMPARISON:  Lumbar spine MRI dated 02/12/2014 FINDINGS: There is emphysematous changes of the lung bases. No intra-abdominal free air or free fluid. Cholecystectomy. There is a 2.2 cm stone in the uncinate process of the pancreas or within the central CBD at  the head of the pancreas. There is dilatation central CBD measuring up to 14 mm. A 4 mm stone is noted in the head of the pancreas above the larger calculus. There is diffuse dilatation of the main pancreatic duct with gland atrophy. There is a 3.1 x 4.1 cm hypodensity in the head and uncinate process of the  pancreas. This likely represents a hypodense lesion in the head of the pancreas and less likely related to dilated pancreatic ducts. Further evaluation with MRI and MRCP recommended. The spleen and adrenal glands are grossly unremarkable. Bilateral renal hypodense lesions measuring up to 3.2 cm in the upper pole of the right kidney are not well characterized. The larger lesion in the upper pole of the right kidney likely represents a cyst. An ill-defined 1.5 cm hypodense lesion in the interpolar aspect of the left kidney is not well characterized on this exam. MRI may provide better characterization. There is no hydronephrosis on either side. The visualized ureters and urinary bladder appear unremarkable. The uterus is anteverted. Small focus of calcification within the uterus likely related to a calcified fibroid. The visualized ovaries are grossly unremarkable. There is sigmoid diverticulosis without active inflammatory changes. There is no evidence of bowel obstruction or active inflammation. Normal appendix. There is aortoiliac atherosclerotic disease. There is no dissection or aneurysmal dilatation of the abdominal aorta. There is atherosclerotic calcification of the origins of the celiac axis, SMA, and the origins of the renal arteries. The origins of the celiac axis, SMA, IMA as well as the origins of the renal arteries however remain patent. 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 The splenic vein, main portal vein, and SMV appear patent. No portal venous gas identified. There is apparent thickening of the left femoral pains with luminal narrowing. This may be related to timing of  the contrast and mixing artifact or represent chronic thrombus/scarring. Acute thrombus is less likely but not excluded. Clinical correlation is recommended. Duplex ultrasound of these veins in may provide better evaluation if there is clinical concern for DVT. There is no adenopathy. There is a small fat containing umbilical hernia. A ventral hernia repair mesh may be present. The abdominal wall soft tissues are otherwise unremarkable. There is osteopenia with degenerative changes of the spine and grade 1 L5-S1 anterolisthesis. Multilevel disc desiccation with vacuum phenomena. No acute fracture. Old healed left rib fractures partially visualized. Review of the MIP images confirms the above findings. IMPRESSION: No CT evidence of abdominal aortic dissection or aneurysm. 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 Mixing artifact versus a chronic thrombus/scarring in the left femoral venous system. Acute thrombus is less likely. Duplex ultrasound may provide better evaluation of these veins if there is clinical concern for acute DVT. A 3.1 x 4.1 cm hypodense lesion at the head of the pancreas with associated gland atrophy with dilatation of the main pancreatic duct. MRI without and with contrast is recommended for further characterization. A 2.2 cm calcific density in the uncinate process of the pancreas may represent focal calcification of the pancreatic lesion or represent a stone within the central CBD. This can be further evaluated on the MRI with additional MRCP images. Sigmoid diverticulosis with no evidence of bowel obstruction or active inflammation. Electronically Signed   By: Elgie Collard M.D.   On: 02/09/2016 21:22      Assessment/Plan 1. Recent DVT with bleeding and resultant anemia requiring cessation of anticoagulation. This would be a classic indication for IVC filter placement. However, given the patient's very advanced age and the fact that no residual acute DVT was seen on  duplex today, the family has requested that no filter be placed. This is reasonable given the above findings. I have discussed that if they change their mind I will be happy to help in place an IVC filter. I also discussed  these can be removed if desired once the acute phase is over. 2. Acute blood loss anemia. Stable. GI has seen the patient, and it sounds like no further invasive procedures are going to be done at this time. 3. HTN. Stable. On outpatient meds    DEW,JASON, MD  02/13/2016 2:50 PM

## 2016-02-13 NOTE — Progress Notes (Signed)
Patient discharged home with family.  All discharge instructions reviewed and discharge paperwork given to patient.  Patient verbalized understanding.  IV removed in tact.  All questions and concerns addressed. Patient's family at bedside for transfer home.  

## 2016-02-13 NOTE — Discharge Summary (Signed)
[facility] - [place] [facility]   PATIENT NAME: Erin Snyder    MR#:  [id number]  DATE OF BIRTH:  [birth year]  DATE OF ADMISSION:  02/09/2016 ADMITTING PHYSICIAN: Oralia Manis, MD  DATE OF DISCHARGE: 02/13/16  PRIMARY CARE PHYSICIAN: Mickey Farber, MD    ADMISSION DIAGNOSIS:  Back pain [M54.9] Gastrointestinal hemorrhage with melena [K92.1] Anemia, unspecified anemia type [D64.9] Diarrhea, unspecified type [R19.7]  DISCHARGE DIAGNOSIS:  Principal Problem:   GI bleed Active Problems:   CKD (chronic kidney disease), stage III   HTN (hypertension)   HLD (hyperlipidemia)   Hypothyroidism   Deep vein thrombosis (DVT) (HCC)   Peptic ulcer of stomach   SECONDARY DIAGNOSIS:   Past Medical History  Diagnosis Date  . Hypertension   . CHF (congestive heart failure) (HCC)   . Hypothyroidism   . CKD (chronic kidney disease), stage III   . HLD (hyperlipidemia)     HOSPITAL COURSE:   82 year old female with past medical history significant for hypertension, CK D, recent DVT on eliquis, CHF admitted with GI bleed.  #1 GI bleed-eliquis is on hold.Known history of gastric ulcers. -Appreciate GI consult. Received transfusion and hemoglobin stable around 9, today at 10 -Status post EGD showing nonbleeding large healing ulcers. -tolerating solid diet, Continue Protonix, Started on Carafate  #2 recent DVT-still swelling of left leg present. -Eliquis is discontinued due to GI bleed -Vascular consulted and family did not want an IVC filter at this time. -Repeat lower extremity dopplers negative for DVT now-patient follow-up recommended - being discharged on aspirin  #3 urinary tract infection-cultures are pending. Received Rocephin in the hospital. Being discharged on Keflex  #4 hypothyroidism-continue Synthroid  #5 hypokalemia-being replaced. Follow up labs as outpatient.  #6 congestive heart failure-stable. Lasix restarted at discharge.  Physical  therapy consulted. Recommended home health. Patient being discharged today.  DISCHARGE CONDITIONS:   stable  CONSULTS OBTAINED:  Treatment Team:  Annice Needy, MD  DRUG ALLERGIES:  No Known Allergies  DISCHARGE MEDICATIONS:   Current Discharge Medication List    START taking these medications   Details  aspirin EC 81 MG tablet Take 1 tablet (81 mg total) by mouth daily. Qty: 30 tablet, Refills: 2    cephALEXin (KEFLEX) 250 MG capsule Take 1 capsule (250 mg total) by mouth 3 (three) times daily. X 4 more days Qty: 12 capsule, Refills: 0    pantoprazole (PROTONIX) 40 MG tablet Take 1 tablet (40 mg total) by mouth 2 (two) times daily before a meal. Qty: 60 tablet, Refills: 2    sucralfate (CARAFATE) 1 GM/10ML suspension Take 10 mLs (1 g total) by mouth 4 (four) times daily -  with meals and at bedtime. Qty: 420 mL, Refills: 0      CONTINUE these medications which have CHANGED   Details  furosemide (LASIX) 20 MG tablet Take 1 tablet (20 mg total) by mouth every other day. Qty: 30 tablet, Refills: 0    potassium chloride SA (K-DUR,KLOR-CON) 20 MEQ tablet Take 2 tablets (40 mEq total) by mouth daily. Qty: 30 tablet, Refills: 2      CONTINUE these medications which have NOT CHANGED   Details  atorvastatin (LIPITOR) 20 MG tablet Take 20 mg by mouth daily.    Calcium Carbonate-Vitamin D3 (CALCIUM 600+D3) 600-400 MG-UNIT TABS Take 1 tablet by mouth daily.    citalopram (CELEXA) 20 MG tablet Take 20 mg by mouth daily.    donepezil (ARICEPT) 5 MG tablet Take 5  mg by mouth daily. Starting 02/23/16, Pt to take 10 mg Donepezil once daily.    ferrous sulfate 325 (65 FE) MG tablet Take 325 mg by mouth daily.    HYDROcodone-acetaminophen (NORCO/VICODIN) 5-325 MG tablet Take 0.5-1 tablets by mouth 2 (two) times daily as needed for moderate pain.     levothyroxine (SYNTHROID, LEVOTHROID) 88 MCG tablet Take 88 mcg by mouth daily before breakfast.    Multiple Vitamin (MULTIVITAMIN)  tablet Take 1 tablet by mouth daily.    primidone (MYSOLINE) 50 MG tablet Take 50 mg by mouth 2 (two) times daily.     triamcinolone ointment (KENALOG) 0.1 % Apply 1 application topically 2 (two) times daily as needed.      STOP taking these medications     apixaban (ELIQUIS) 5 MG TABS tablet      aspirin 325 MG tablet      losartan (COZAAR) 50 MG tablet          DISCHARGE INSTRUCTIONS:   1. PCP f/u in 1-2 weeks 2. Vascular follow up in 1 week  If you experience worsening of your admission symptoms, develop shortness of breath, life threatening emergency, suicidal or homicidal thoughts you must seek medical attention immediately by calling 911 or calling your MD immediately  if symptoms less severe.  You Must read complete instructions/literature along with all the possible adverse reactions/side effects for all the Medicines you take and that have been prescribed to you. Take any new Medicines after you have completely understood and accept all the possible adverse reactions/side effects.   Please note  You were cared for by a hospitalist during your hospital stay. If you have any questions about your discharge medications or the care you received while you were in the hospital after you are discharged, you can call the unit and asked to speak with the hospitalist on call if the hospitalist that took care of you is not available. Once you are discharged, your primary care physician will handle any further medical issues. Please note that NO REFILLS for any discharge medications will be authorized once you are discharged, as it is imperative that you return to your primary care physician (or establish a relationship with a primary care physician if you do not have one) for your aftercare needs so that they can reassess your need for medications and monitor your lab values.    Today   CHIEF COMPLAINT:   Chief Complaint  Patient presents with  . Diarrhea    sudden onset after  eating    VITAL SIGNS:  Blood pressure 132/56, pulse 75, temperature 98.4 F (36.9 C), temperature source Oral, resp. rate 18, height 5\' 4"  (1.626 m), weight 72.303 kg (159 lb 6.4 oz), SpO2 96 %.  I/O:   Intake/Output Summary (Last 24 hours) at 02/13/16 1442 Last data filed at 02/13/16 1300  Gross per 24 hour  Intake    783 ml  Output    700 ml  Net     83 ml    PHYSICAL EXAMINATION:   Physical Exam  GENERAL: 82 y.o.-year-old patient lying in the bed with no acute distress.  EYES: legally blind. No scleral icterus. Extraocular muscles intact.  HEENT: Head atraumatic, normocephalic. Oropharynx and nasopharynx clear.  NECK: Supple, no jugular venous distention. No thyroid enlargement, no tenderness.  LUNGS: Normal breath sounds bilaterally, no wheezing, rales,rhonchi or crepitation. No use of accessory muscles of respiration.  CARDIOVASCULAR: S1, S2 normal. No rubs, or gallops. 3/6 systolic murmur present.  ABDOMEN: Soft, nontender, nondistended. Bowel sounds present. No organomegaly or mass.  EXTREMITIES: No cyanosis, or clubbing. 1+ legs swelling NEUROLOGIC: Cranial nerves II through XII are intact. Muscle strength 5/5 in all extremities. Sensation intact. Gait not checked.  PSYCHIATRIC: The patient is alert and oriented x 3.  SKIN: No obvious rash, lesion, or ulcer.   DATA REVIEW:   CBC  Recent Labs Lab 02/12/16 0548  WBC 8.2  HGB 10.0*  HCT 29.4*  PLT 188    Chemistries   Recent Labs Lab 02/13/16 0731  NA 137  K 2.9*  CL 108  CO2 27  GLUCOSE 90  BUN 12  CREATININE 0.72  CALCIUM 8.0*    Cardiac Enzymes  Recent Labs Lab 02/09/16 1918  TROPONINI <0.03    Microbiology Results  No results found for this or any previous visit.  RADIOLOGY:  US Venous Img Lower Bilateral  02/11/2016  CLINICAL DATA:  82 year old with personal history of left lower extremity DVT, presenting with bilateral lower extremity edema. EXAM: BILATERAL LOWER  EXTREMITY VENOUS DOPPLER ULTRASOUND TECHNIQUE: Gray-scale sonography with graded compression, as well as color Doppler and duplex ultrasound were performed to evaluate the lower extremity deep venous systems from the level of the common femoral vein and including the common femoral, femoral, profunda femoral, popliteal and calf veins including the posterior tibial, peroneal and gastrocnemius veins when visible. The superficial great saphenous vein was also interrogated. Spectral Doppler was utilized to evaluate flow at rest and with distal augmentation maneuvers in the common femoral, femoral and popliteal veins. COMPARISON:  None. FINDINGS: RIGHT LOWER EXTREMITY Common Femoral Vein: No evidence of thrombus. Normal compressibility, respiratory phasicity and response to augmentation. Saphenofemoral Junction: No evidence of thrombus. Normal compressibility and flow on color Doppler imaging. Profunda Femoral Vein: No evidence of thrombus. Normal compressibility and flow on color Doppler imaging. Femoral Vein: No evidence of thrombus. Normal compressibility, respiratory phasicity and response to augmentation. Popliteal Vein: No evidence of thrombus. Normal compressibility, respiratory phasicity and response to augmentation. Calf Veins: No evidence of thrombus. Normal compressibility and flow on color Doppler imaging. Venous Reflux:  Not evaluated. Other Findings:  None. LEFT LOWER EXTREMITY Common Femoral Vein: No evidence of thrombus. Normal compressibility, respiratory phasicity and response to augmentation. Saphenofemoral Junction: No evidence of thrombus. Normal compressibility and flow on color Doppler imaging. Profunda Femoral Vein: No evidence of thrombus. Normal compressibility and flow on color Doppler imaging. Femoral Vein: No evidence of thrombus. Normal compressibility, respiratory phasicity and response to augmentation. Popliteal Vein: No evidence of thrombus. Normal compressibility, respiratory phasicity  and response to augmentation. Calf Veins: No evidence of thrombus. Normal compressibility and flow on color Doppler imaging. Venous Reflux:  Not evaluated. Other Findings:  None. IMPRESSION: No evidence of DVT involving either the right or left lower extremity. Electronically Signed   By: Hulan Saas M.D.   On: 02/11/2016 18:15    EKG:   Orders placed or performed during the hospital encounter of 02/09/16  . ED EKG  . ED EKG      Management plans discussed with the patient, family and they are in agreement.  CODE STATUS:     Code Status Orders        Start     Ordered   02/09/16 2339  Do not attempt resuscitation (DNR)   Continuous    Question Answer Comment  In the event of cardiac or respiratory ARREST Do not call a "code blue"   In the event of cardiac  or respiratory ARREST Do not perform Intubation, CPR, defibrillation or ACLS   In the event of cardiac or respiratory ARREST Use medication by any route, position, wound care, and other measures to relive pain and suffering. May use oxygen, suction and manual treatment of airway obstruction as needed for comfort.      02/09/16 2338    Code Status History    Date Active Date Inactive Code Status Order ID Comments User Context   This patient has a current code status but no historical code status.    Advance Directive Documentation        Most Recent Value   Type of Advance Directive  Healthcare Power of Attorney, Out of facility DNR (pink MOST or yellow form)   Pre-existing out of facility DNR order (yellow form or pink MOST form)  Physician notified to receive inpatient order   "MOST" Form in Place?        TOTAL TIME TAKING CARE OF THIS PATIENT: 37 minutes.    Enid BaasKALISETTI,Irisha Grandmaison M.D on 02/13/2016 at 2:42 PM  Between 7am to 6pm - Pager - [phone]  After 6pm go to [email or web address] - password [email or web address] [facility]  [facility]  Office  [phone]  CC: Primary care physician; Mickey FarberHIES, DAVID, MD

## 2016-02-13 NOTE — Progress Notes (Signed)
Dr Nemiah CommanderKalisetti notified of critical K+ of 2.9.

## 2016-02-14 ENCOUNTER — Encounter: Payer: Self-pay | Admitting: Gastroenterology

## 2016-02-24 DIAGNOSIS — K259 Gastric ulcer, unspecified as acute or chronic, without hemorrhage or perforation: Secondary | ICD-10-CM | POA: Insufficient documentation

## 2016-04-26 DIAGNOSIS — Z66 Do not resuscitate: Secondary | ICD-10-CM | POA: Insufficient documentation

## 2016-04-27 DIAGNOSIS — M25511 Pain in right shoulder: Secondary | ICD-10-CM

## 2016-04-27 DIAGNOSIS — G8929 Other chronic pain: Secondary | ICD-10-CM | POA: Insufficient documentation

## 2016-04-27 DIAGNOSIS — M19019 Primary osteoarthritis, unspecified shoulder: Secondary | ICD-10-CM | POA: Insufficient documentation

## 2016-05-03 ENCOUNTER — Ambulatory Visit: Payer: Medicare Other | Admitting: Urology

## 2016-05-09 ENCOUNTER — Ambulatory Visit (INDEPENDENT_AMBULATORY_CARE_PROVIDER_SITE_OTHER): Payer: Medicare Other | Admitting: Urology

## 2016-05-09 ENCOUNTER — Encounter: Payer: Self-pay | Admitting: Urology

## 2016-05-09 VITALS — BP 148/80 | HR 69 | Ht 60.0 in | Wt 162.0 lb

## 2016-05-09 DIAGNOSIS — N952 Postmenopausal atrophic vaginitis: Secondary | ICD-10-CM

## 2016-05-09 DIAGNOSIS — R32 Unspecified urinary incontinence: Secondary | ICD-10-CM

## 2016-05-09 DIAGNOSIS — R350 Frequency of micturition: Secondary | ICD-10-CM | POA: Diagnosis not present

## 2016-05-09 LAB — URINALYSIS, COMPLETE
Bilirubin, UA: NEGATIVE
Glucose, UA: NEGATIVE
Ketones, UA: NEGATIVE
Leukocytes, UA: NEGATIVE
Nitrite, UA: NEGATIVE
Protein, UA: NEGATIVE
Specific Gravity, UA: <1.005 — ABNORMAL LOW (ref 1.005–1.030)
Urobilinogen, Ur: 0.2 mg/dL (ref 0.2–1.0)
pH, UA: 5.5 (ref 5.0–7.5)

## 2016-05-09 LAB — MICROSCOPIC EXAMINATION: Bacteria, UA: NONE SEEN

## 2016-05-09 LAB — BLADDER SCAN AMB NON-IMAGING: SCAN RESULT: 56

## 2016-05-09 MED ORDER — ESTROGENS, CONJUGATED 0.625 MG/GM VA CREA: 1.0000 | TOPICAL_CREAM | Freq: Every day | VAGINAL | 12 refills | Status: DC

## 2016-05-09 MED ORDER — ESTRADIOL 0.1 MG/GM VA CREA: TOPICAL_CREAM | VAGINAL | 12 refills | Status: DC

## 2016-05-09 NOTE — Patient Instructions (Signed)
I have given you two prescriptions for a vaginal estrogen cream.  Estrace and Premarin.  Please take these to your pharmacy and see which one your insurance covers.  If both are too expensive, please call the office at [phone] for an alternative.  You are given a sample of vaginal estrogen cream (Estrace) and instructed to apply 0.5mg (pea-sized amount)  just inside the vaginal introitus with a finger-tip every night for two weeks.    

## 2016-05-09 NOTE — Progress Notes (Signed)
05/09/2016 2:58 PM   Erin Snyder 1923/08/16 [id number]  Referring provider: Mickey Farber, MD [address] [facility] [place], [place] [address]  Chief Complaint  Patient presents with  . Urinary Frequency    new patient referred by Dr. Audelia Acton    HPI: Patient is a 82 year old Caucasian female who presents with her caregiver, who is new to the patient, as referral from by Dr. Audelia Acton for urinary frequency.  She is very hard of hearing and it is difficult to obtain a history.    She states that as soon as she sits down from going to the bathroom, she has to go to the bathroom again.  This suddenly started the first of August.  She is having urgency, nocturia x 1-2 and urinary leakage.  She is going through 6 pads daily.  She denies dysuria, suprapubic pain and gross hematuria.  She has not had fevers, chills, nausea and/or vomiting.  She is drinking sodas and coffee through out the day.    Dr. Audelia Acton recently started Detrol 1 mg bid two weeks ago.    She does not have a history of nephrolithiasis.    Her UA is clear.  Her PVR is 56 mL.   PMH: Past Medical History:  Diagnosis Date  . CHF (congestive heart failure) (HCC)   . CKD (chronic kidney disease), stage III   . HLD (hyperlipidemia)   . Hypertension   . Hypothyroidism     Surgical History: Past Surgical History:  Procedure Laterality Date  . CHOLECYSTECTOMY    . ESOPHAGOGASTRODUODENOSCOPY (EGD) WITH PROPOFOL N/A 02/10/2016   Procedure: ESOPHAGOGASTRODUODENOSCOPY (EGD) WITH PROPOFOL;  Surgeon: Midge Minium, MD;  Location: [facility] ENDOSCOPY;  Service: Endoscopy;  Laterality: N/A;    Home Medications:    Medication List       Accurate as of 05/09/16  2:58 PM. Always use your most recent med list.          aspirin EC 81 MG tablet Take 1 tablet (81 mg total) by mouth daily.   atorvastatin 20 MG tablet Commonly known as:  LIPITOR Take 20 mg by mouth daily.   CALCIUM 600+D3 600-400 MG-UNIT  Tabs Generic drug:  Calcium Carbonate-Vitamin D3 Take 1 tablet by mouth daily.   Calcium Carbonate-Vitamin D 600-400 MG-UNIT tablet Take by mouth.   cephALEXin 250 MG capsule Commonly known as:  KEFLEX Take 1 capsule (250 mg total) by mouth 3 (three) times daily. X 4 more days   citalopram 20 MG tablet Commonly known as:  CELEXA Take 20 mg by mouth daily.   conjugated estrogens vaginal cream Commonly known as:  PREMARIN Place 1 Applicatorful vaginally daily. Apply 0.5mg  (pea-sized amount)  just inside the vaginal introitus with a finger-tip every night for two weeks and then Monday, Wednesday and Friday nights.   donepezil 10 MG tablet Commonly known as:  ARICEPT Take by mouth.   estradiol 0.1 MG/GM vaginal cream Commonly known as:  ESTRACE VAGINAL Apply 0.5mg  (pea-sized amount)  just inside the vaginal introitus with a finger-tip every night for two weeks and then Monday, Wednesday and Friday nights.   ferrous sulfate 325 (65 FE) MG tablet Take 325 mg by mouth daily.   furosemide 20 MG tablet Commonly known as:  LASIX Take 1 tablet (20 mg total) by mouth every other day.   HYDROcodone-acetaminophen 5-325 MG tablet Commonly known as:  NORCO/VICODIN Take 0.5-1 tablets by mouth 2 (two) times daily as needed for moderate  pain.   levothyroxine 88 MCG tablet Commonly known as:  SYNTHROID, LEVOTHROID Take 88 mcg by mouth daily before breakfast.   multivitamin tablet Take 1 tablet by mouth daily.   pantoprazole 40 MG tablet Commonly known as:  PROTONIX Take 1 tablet (40 mg total) by mouth 2 (two) times daily before a meal.   potassium chloride SA 20 MEQ tablet Commonly known as:  K-DUR,KLOR-CON Take 2 tablets (40 mEq total) by mouth daily.   primidone 50 MG tablet Commonly known as:  MYSOLINE Take 50 mg by mouth 2 (two) times daily.   sucralfate 1 GM/10ML suspension Commonly known as:  CARAFATE Take 10 mLs (1 g total) by mouth 4 (four) times daily -  with meals and  at bedtime.   tolterodine 1 MG tablet Commonly known as:  DETROL Take by mouth.   triamcinolone ointment 0.1 % Commonly known as:  KENALOG Apply 1 application topically 2 (two) times daily as needed.       Allergies:  Allergies  Allergen Reactions  . Ace Inhibitors     Other reaction(s): Cough    Family History: Family History  Problem Relation Age of Onset  . Kidney cancer    . Cerebral palsy    . Heart attack    . Stroke      Social History:  reports that she has never smoked. She has never used smokeless tobacco. She reports that she does not drink alcohol or use drugs.  ROS: UROLOGY Frequent Urination?: Yes Hard to postpone urination?: Yes Burning/pain with urination?: No Get up at night to urinate?: Yes Leakage of urine?: Yes Urine stream starts and stops?: No Trouble starting stream?: No Do you have to strain to urinate?: No Blood in urine?: No Urinary tract infection?: No Sexually transmitted disease?: No Injury to kidneys or bladder?: No Painful intercourse?: No Weak stream?: No Currently pregnant?: No Vaginal bleeding?: No Last menstrual period?: n  Gastrointestinal Nausea?: No Vomiting?: No Indigestion/heartburn?: No Diarrhea?: No Constipation?: No  Constitutional Fever: No Night sweats?: No Weight loss?: No Fatigue?: No  Skin Skin rash/lesions?: No Itching?: Yes  Eyes Blurred vision?: No Double vision?: No  Ears/Nose/Throat Sore throat?: No Sinus problems?: No  Hematologic/Lymphatic Swollen glands?: No Easy bruising?: Yes  Cardiovascular Leg swelling?: Yes Chest pain?: No  Respiratory Cough?: Yes Shortness of breath?: Yes  Endocrine Excessive thirst?: No  Musculoskeletal Back pain?: Yes Joint pain?: Yes  Neurological Headaches?: No Dizziness?: No  Psychologic Depression?: No Anxiety?: No  Physical Exam: BP (!) 148/80   Pulse 69   Ht 5' (1.524 m)   Wt 162 lb (73.5 kg)   BMI 31.64 kg/m     Constitutional: Well nourished. Alert and oriented, No acute distress. HEENT: [place] AT, moist mucus membranes. Trachea midline, no masses. Cardiovascular: No clubbing, cyanosis, or edema. Respiratory: Normal respiratory effort, no increased work of breathing. GI: Abdomen is soft, non tender, non distended, no abdominal masses. Liver and spleen not palpable.  No hernias appreciated.  Stool sample for occult testing is not indicated.   GU: No CVA tenderness.  No bladder fullness or masses.  Atrophic external genitalia, normal pubic hair distribution, no lesions.  Normal urethral meatus, no lesions, no prolapse, no discharge.   No urethral masses, tenderness and/or tenderness. No bladder fullness, tenderness or masses. Pale vaginal mucosa, poor estrogen effect, no discharge, no lesions, good pelvic support, no cystocele or rectocele noted.  No cervical motion tenderness.  Uterus is freely mobile and non-fixed.  No  adnexal/parametria masses or tenderness noted.  Anus and perineum are without rashes or lesions.  Was incontinent of urine during the pelvic exam.   Skin: No rashes, bruises or suspicious lesions. Lymph: No cervical or inguinal adenopathy. Neurologic: Grossly intact, no focal deficits, moving all 4 extremities. Psychiatric: Normal mood and affect.  Laboratory Data: Lab Results  Component Value Date   WBC 8.2 02/12/2016   HGB 10.0 (L) 02/12/2016   HCT 29.4 (L) 02/12/2016   MCV 88.6 02/12/2016   PLT 188 02/12/2016    Lab Results  Component Value Date   CREATININE 0.72 02/13/2016   Urinalysis Unremarkable.  See EPIC.    Pertinent Imaging: Results for Erin AmmonsMERSON, Kadedra T (MRN [id number]) as of 05/09/2016 14:31  Ref. Range 05/09/2016 14:12  Scan Result Unknown 56    Assessment & Plan:    1. Urinary frequency  - dicussed with the patient to stop or significantly reduce her soda and coffee intake  - started on medical therapy with anticholinergic therapy by PCP, explained that it  takes 3 months for medication to take effect  - RTC in one month for PVR and symptom recheck   - Urinalysis, Complete  - Bladder Scan (Post Void Residual) in office  2. Incontinence  - see above   3. Atrophic vaginitis  - I explained to the patient that when women go through menopause and her estrogen levels are severely diminished, the normal vaginal mucosa will change.  This may cause bladder irritation resulted in incontinence and urinary frequency.    Patient was given a sample of vaginal estrogen cream (Estrace) and instructed to apply 0.5mg  (pea-sized amount)  just inside the vaginal introitus with a finger-tip every night for two weeks and then Monday, Wednesday and Friday nights.  I explained to the patient that vaginally administered estrogen, which causes only a slight increase in the blood estrogen levels, have fewer contraindications and adverse systemic effects that oral HT.  I have also given prescriptions for the Estrace cream and Premarin cream, so that the patient may carry them to the pharmacy to see which one of the branded creams would be most economical for her.  She will return in 1 month for symptom recheck, exam and report if she was available to require the vaginal cream by prescription.   Return in about 1 month (around 06/09/2016) for exam and symptom recheck.  These notes generated with voice recognition software. I apologize for typographical errors.  Michiel CowboySHANNON Dilyn Osoria, PA-C  [facility] [address], [address] [place], [place] [address] [phone]

## 2016-05-16 ENCOUNTER — Inpatient Hospital Stay
Admission: EM | Admit: 2016-05-16 | Discharge: 2016-05-18 | DRG: 871 | Disposition: A | Discharge status: Home / Self Care | Payer: Medicare Other | Attending: Specialist | Admitting: Specialist

## 2016-05-16 ENCOUNTER — Emergency Department: Payer: Medicare Other

## 2016-05-16 ENCOUNTER — Encounter: Payer: Self-pay | Admitting: Emergency Medicine

## 2016-05-16 DIAGNOSIS — R000 Tachycardia, unspecified: Secondary | ICD-10-CM | POA: Diagnosis present

## 2016-05-16 DIAGNOSIS — I82402 Acute embolism and thrombosis of unspecified deep veins of left lower extremity: Secondary | ICD-10-CM | POA: Diagnosis present

## 2016-05-16 DIAGNOSIS — E039 Hypothyroidism, unspecified: Secondary | ICD-10-CM | POA: Diagnosis present

## 2016-05-16 DIAGNOSIS — H548 Legal blindness, as defined in USA: Secondary | ICD-10-CM | POA: Diagnosis present

## 2016-05-16 DIAGNOSIS — Z66 Do not resuscitate: Secondary | ICD-10-CM | POA: Diagnosis present

## 2016-05-16 DIAGNOSIS — R778 Other specified abnormalities of plasma proteins: Secondary | ICD-10-CM

## 2016-05-16 DIAGNOSIS — G9340 Encephalopathy, unspecified: Secondary | ICD-10-CM | POA: Diagnosis present

## 2016-05-16 DIAGNOSIS — R6510 Systemic inflammatory response syndrome (SIRS) of non-infectious origin without acute organ dysfunction: Secondary | ICD-10-CM | POA: Diagnosis present

## 2016-05-16 DIAGNOSIS — Z820 Family history of epilepsy and other diseases of the nervous system: Secondary | ICD-10-CM

## 2016-05-16 DIAGNOSIS — N183 Chronic kidney disease, stage 3 (moderate): Secondary | ICD-10-CM | POA: Diagnosis present

## 2016-05-16 DIAGNOSIS — F0390 Unspecified dementia without behavioral disturbance: Secondary | ICD-10-CM | POA: Diagnosis present

## 2016-05-16 DIAGNOSIS — Z888 Allergy status to other drugs, medicaments and biological substances status: Secondary | ICD-10-CM | POA: Diagnosis not present

## 2016-05-16 DIAGNOSIS — I130 Hypertensive heart and chronic kidney disease with heart failure and stage 1 through stage 4 chronic kidney disease, or unspecified chronic kidney disease: Secondary | ICD-10-CM | POA: Diagnosis present

## 2016-05-16 DIAGNOSIS — Z8249 Family history of ischemic heart disease and other diseases of the circulatory system: Secondary | ICD-10-CM

## 2016-05-16 DIAGNOSIS — E860 Dehydration: Secondary | ICD-10-CM | POA: Diagnosis present

## 2016-05-16 DIAGNOSIS — Z9049 Acquired absence of other specified parts of digestive tract: Secondary | ICD-10-CM

## 2016-05-16 DIAGNOSIS — N179 Acute kidney failure, unspecified: Secondary | ICD-10-CM | POA: Diagnosis present

## 2016-05-16 DIAGNOSIS — R531 Weakness: Secondary | ICD-10-CM | POA: Diagnosis not present

## 2016-05-16 DIAGNOSIS — I5032 Chronic diastolic (congestive) heart failure: Secondary | ICD-10-CM | POA: Diagnosis present

## 2016-05-16 DIAGNOSIS — Z8051 Family history of malignant neoplasm of kidney: Secondary | ICD-10-CM | POA: Diagnosis not present

## 2016-05-16 DIAGNOSIS — Z79899 Other long term (current) drug therapy: Secondary | ICD-10-CM

## 2016-05-16 DIAGNOSIS — A419 Sepsis, unspecified organism: Secondary | ICD-10-CM | POA: Diagnosis not present

## 2016-05-16 DIAGNOSIS — R7989 Other specified abnormal findings of blood chemistry: Secondary | ICD-10-CM

## 2016-05-16 DIAGNOSIS — Z9889 Other specified postprocedural states: Secondary | ICD-10-CM

## 2016-05-16 DIAGNOSIS — H9190 Unspecified hearing loss, unspecified ear: Secondary | ICD-10-CM | POA: Diagnosis present

## 2016-05-16 DIAGNOSIS — Z7982 Long term (current) use of aspirin: Secondary | ICD-10-CM

## 2016-05-16 DIAGNOSIS — N390 Urinary tract infection, site not specified: Secondary | ICD-10-CM

## 2016-05-16 DIAGNOSIS — Z823 Family history of stroke: Secondary | ICD-10-CM

## 2016-05-16 LAB — CBC WITH DIFFERENTIAL/PLATELET
BASOS PCT: 0 %
Basophils Absolute: 0 10*3/uL (ref 0–0.1)
EOS ABS: 0 10*3/uL (ref 0–0.7)
EOS PCT: 0 %
HCT: 34.1 % — ABNORMAL LOW (ref 35.0–47.0)
Hemoglobin: 11.8 g/dL — ABNORMAL LOW (ref 12.0–16.0)
LYMPHS ABS: 0.4 10*3/uL — AB (ref 1.0–3.6)
Lymphocytes Relative: 4 %
MCH: 29.3 pg (ref 26.0–34.0)
MCHC: 34.6 g/dL (ref 32.0–36.0)
MCV: 84.5 fL (ref 80.0–100.0)
Monocytes Absolute: 1.3 10*3/uL — ABNORMAL HIGH (ref 0.2–0.9)
Monocytes Relative: 12 %
NEUTROS PCT: 84 %
Neutro Abs: 9.2 10*3/uL — ABNORMAL HIGH (ref 1.4–6.5)
PLATELETS: 116 10*3/uL — AB (ref 150–440)
RBC: 4.04 MIL/uL (ref 3.80–5.20)
RDW: 14.8 % — ABNORMAL HIGH (ref 11.5–14.5)
WBC: 10.9 10*3/uL (ref 3.6–11.0)

## 2016-05-16 LAB — COMPREHENSIVE METABOLIC PANEL
ALK PHOS: 64 U/L (ref 38–126)
ALT: 20 U/L (ref 14–54)
AST: 27 U/L (ref 15–41)
Albumin: 2.8 g/dL — ABNORMAL LOW (ref 3.5–5.0)
Anion gap: 6 (ref 5–15)
BILIRUBIN TOTAL: 0.8 mg/dL (ref 0.3–1.2)
BUN: 18 mg/dL (ref 6–20)
CALCIUM: 8.9 mg/dL (ref 8.9–10.3)
CO2: 23 mmol/L (ref 22–32)
CREATININE: 1.16 mg/dL — AB (ref 0.44–1.00)
Chloride: 101 mmol/L (ref 101–111)
GFR, EST AFRICAN AMERICAN: 46 mL/min — AB (ref >60)
GFR, EST NON AFRICAN AMERICAN: 40 mL/min — AB (ref >60)
Glucose, Bld: 120 mg/dL — ABNORMAL HIGH (ref 65–99)
Potassium: 4.1 mmol/L (ref 3.5–5.1)
Sodium: 130 mmol/L — ABNORMAL LOW (ref 135–145)
TOTAL PROTEIN: 6.1 g/dL — AB (ref 6.5–8.1)

## 2016-05-16 LAB — URINALYSIS COMPLETE WITH MICROSCOPIC (ARMC ONLY)
Bilirubin Urine: NEGATIVE
Glucose, UA: NEGATIVE mg/dL
Ketones, ur: NEGATIVE mg/dL
NITRITE: NEGATIVE
PROTEIN: 100 mg/dL — AB
SPECIFIC GRAVITY, URINE: 1.009 (ref 1.005–1.030)
pH: 5 (ref 5.0–8.0)

## 2016-05-16 LAB — LACTIC ACID, PLASMA
Lactic Acid, Venous: 1 mmol/L (ref 0.5–1.9)
Lactic Acid, Venous: 2.3 mmol/L (ref 0.5–1.9)

## 2016-05-16 LAB — TROPONIN I: Troponin I: 0.03 ng/mL (ref <0.03)

## 2016-05-16 MED ORDER — OXYBUTYNIN CHLORIDE ER 5 MG PO TB24
5.0000 mg | ORAL_TABLET | Freq: Every day | ORAL | Status: DC
  Administered 2016-05-16 – 2016-05-17 (×2): 5 mg via ORAL
  Filled 2016-05-16 (×3): qty 1

## 2016-05-16 MED ORDER — ENOXAPARIN SODIUM 30 MG/0.3ML ~~LOC~~ SOLN
30.0000 mg | SUBCUTANEOUS | Status: DC
  Administered 2016-05-16 – 2016-05-17 (×2): 30 mg via SUBCUTANEOUS
  Filled 2016-05-16 (×2): qty 0.3

## 2016-05-16 MED ORDER — SODIUM CHLORIDE 0.9 % IV SOLN
INTRAVENOUS | Status: DC
  Administered 2016-05-16 – 2016-05-17 (×3): via INTRAVENOUS

## 2016-05-16 MED ORDER — DEXTROSE 5 % IV SOLN
1.0000 g | INTRAVENOUS | Status: DC
  Administered 2016-05-16: 1 g via INTRAVENOUS
  Filled 2016-05-16: qty 10

## 2016-05-16 MED ORDER — ASPIRIN EC 81 MG PO TBEC
81.0000 mg | DELAYED_RELEASE_TABLET | Freq: Every day | ORAL | Status: DC
  Administered 2016-05-16 – 2016-05-18 (×3): 81 mg via ORAL
  Filled 2016-05-16 (×3): qty 1

## 2016-05-16 MED ORDER — CITALOPRAM HYDROBROMIDE 20 MG PO TABS
20.0000 mg | ORAL_TABLET | Freq: Every day | ORAL | Status: DC
  Administered 2016-05-16 – 2016-05-18 (×3): 20 mg via ORAL
  Filled 2016-05-16 (×3): qty 1

## 2016-05-16 MED ORDER — ACETAMINOPHEN 650 MG RE SUPP: 650.0000 mg | Freq: Four times a day (QID) | RECTAL | Status: DC | PRN

## 2016-05-16 MED ORDER — PIPERACILLIN-TAZOBACTAM 3.375 G IVPB 30 MIN
3.3750 g | Freq: Once | INTRAVENOUS | Status: AC
  Administered 2016-05-16: 3.375 g via INTRAVENOUS
  Filled 2016-05-16: qty 50

## 2016-05-16 MED ORDER — ONDANSETRON HCL 4 MG/2ML IJ SOLN: 4.0000 mg | Freq: Four times a day (QID) | INTRAMUSCULAR | Status: DC | PRN

## 2016-05-16 MED ORDER — VANCOMYCIN HCL IN DEXTROSE 1-5 GM/200ML-% IV SOLN
1000.0000 mg | Freq: Once | INTRAVENOUS | Status: AC
  Administered 2016-05-16: 1000 mg via INTRAVENOUS
  Filled 2016-05-16: qty 200

## 2016-05-16 MED ORDER — FERROUS SULFATE 325 (65 FE) MG PO TABS
325.0000 mg | ORAL_TABLET | Freq: Every day | ORAL | Status: DC
  Administered 2016-05-16 – 2016-05-18 (×3): 325 mg via ORAL
  Filled 2016-05-16 (×3): qty 1

## 2016-05-16 MED ORDER — CALCIUM CARBONATE-VITAMIN D 500-200 MG-UNIT PO TABS
1.0000 | ORAL_TABLET | Freq: Every day | ORAL | Status: DC
Start: 2016-05-16 — End: 2016-05-18
  Administered 2016-05-16 – 2016-05-18 (×3): 1 via ORAL
  Filled 2016-05-16 (×3): qty 1

## 2016-05-16 MED ORDER — SODIUM CHLORIDE 0.9 % IV BOLUS (SEPSIS)
250.0000 mL | Freq: Once | INTRAVENOUS | Status: AC
  Administered 2016-05-16: 250 mL via INTRAVENOUS

## 2016-05-16 MED ORDER — SENNOSIDES-DOCUSATE SODIUM 8.6-50 MG PO TABS: 1.0000 | ORAL_TABLET | Freq: Every evening | ORAL | Status: DC | PRN

## 2016-05-16 MED ORDER — ATORVASTATIN CALCIUM 20 MG PO TABS
20.0000 mg | ORAL_TABLET | Freq: Every day | ORAL | Status: DC
  Administered 2016-05-16 – 2016-05-18 (×3): 20 mg via ORAL
  Filled 2016-05-16 (×3): qty 1

## 2016-05-16 MED ORDER — ADULT MULTIVITAMIN W/MINERALS CH
1.0000 | ORAL_TABLET | Freq: Every day | ORAL | Status: DC
  Administered 2016-05-16 – 2016-05-18 (×3): 1 via ORAL
  Filled 2016-05-16 (×3): qty 1

## 2016-05-16 MED ORDER — DEXTROSE 5 % IV SOLN: 1.0000 g | INTRAVENOUS | Status: DC

## 2016-05-16 MED ORDER — ONDANSETRON HCL 4 MG PO TABS: 4.0000 mg | ORAL_TABLET | Freq: Four times a day (QID) | ORAL | Status: DC | PRN

## 2016-05-16 MED ORDER — ENOXAPARIN SODIUM 40 MG/0.4ML ~~LOC~~ SOLN: 40.0000 mg | SUBCUTANEOUS | Status: DC

## 2016-05-16 MED ORDER — HYDROCODONE-ACETAMINOPHEN 5-325 MG PO TABS: 0.5000 | ORAL_TABLET | Freq: Two times a day (BID) | ORAL | Status: DC | PRN

## 2016-05-16 MED ORDER — PANTOPRAZOLE SODIUM 40 MG PO TBEC
40.0000 mg | DELAYED_RELEASE_TABLET | Freq: Two times a day (BID) | ORAL | Status: DC
  Administered 2016-05-17 – 2016-05-18 (×2): 40 mg via ORAL
  Filled 2016-05-16 (×2): qty 1

## 2016-05-16 MED ORDER — LEVOTHYROXINE SODIUM 88 MCG PO TABS
88.0000 ug | ORAL_TABLET | Freq: Every day | ORAL | Status: DC
  Administered 2016-05-17 – 2016-05-18 (×2): 88 ug via ORAL
  Filled 2016-05-16 (×2): qty 1

## 2016-05-16 MED ORDER — DONEPEZIL HCL 5 MG PO TABS
10.0000 mg | ORAL_TABLET | Freq: Every day | ORAL | Status: DC
  Administered 2016-05-16 – 2016-05-17 (×2): 10 mg via ORAL
  Filled 2016-05-16 (×2): qty 2

## 2016-05-16 MED ORDER — SODIUM CHLORIDE 0.9 % IV BOLUS (SEPSIS)
1000.0000 mL | Freq: Once | INTRAVENOUS | Status: AC
  Administered 2016-05-16: 1000 mL via INTRAVENOUS

## 2016-05-16 MED ORDER — PRIMIDONE 50 MG PO TABS
50.0000 mg | ORAL_TABLET | Freq: Two times a day (BID) | ORAL | Status: DC
  Administered 2016-05-16 – 2016-05-18 (×4): 50 mg via ORAL
  Filled 2016-05-16 (×7): qty 1

## 2016-05-16 MED ORDER — ACETAMINOPHEN 325 MG PO TABS: 650.0000 mg | ORAL_TABLET | Freq: Four times a day (QID) | ORAL | Status: DC | PRN

## 2016-05-16 NOTE — ED Notes (Signed)
Floor unable to take report at this time. Told they will call back asap.

## 2016-05-16 NOTE — H&P (Signed)
[facility] - [place] at [facility]   PATIENT NAME: Erin Snyder Filippini    MR#:  [id number]  DATE OF BIRTH:  05/31/1923  DATE OF ADMISSION:  05/16/2016  PRIMARY CARE PHYSICIAN: Mickey FarberHIES, DAVID, MD   REQUESTING/REFERRING PHYSICIAN: Dr Don Perkingveronese  CHIEF COMPLAINT:   Generalized weakness and low-grade fever and not her usual self HISTORY OF PRESENT ILLNESS:  Erin Snyder Agard  is a 82 y.o. female with a known history of CHF, CK B stage III, hyperlipidemia, hypertension comes to the emergency room brought in from home by her son with fever and weakness. Patient is hard of hearing and is legally blind. She was very weak when EMS went to pick her up. Normally she gets around with her routine chores have a more since yesterday she has not been able to do so and has been in bed most of the day yesterday and today. She was found to have low-grade temperature she was tachycardic in the emergency room and urine was suggestive of UTI. She received a dose of IV vancomycin and Zosyn with sepsis protocol. Patient does not appear septic at present she is being admitted with sores secondary to UTI. Lactic acid is normal.  PAST MEDICAL HISTORY:   Past Medical History:  Diagnosis Date  . CHF (congestive heart failure) (HCC)   . CKD (chronic kidney disease), stage III   . HLD (hyperlipidemia)   . Hypertension   . Hypothyroidism     PAST SURGICAL HISTOIRY:   Past Surgical History:  Procedure Laterality Date  . CHOLECYSTECTOMY    . ESOPHAGOGASTRODUODENOSCOPY (EGD) WITH PROPOFOL N/A 02/10/2016   Procedure: ESOPHAGOGASTRODUODENOSCOPY (EGD) WITH PROPOFOL;  Surgeon: Midge Miniumarren Wohl, MD;  Location: [facility] ENDOSCOPY;  Service: Endoscopy;  Laterality: N/A;    SOCIAL HISTORY:   Social History  Substance Use Topics  . Smoking status: Never Smoker  . Smokeless tobacco: Never Used  . Alcohol use No    FAMILY HISTORY:   Family History  Problem Relation Age of Onset  . Kidney cancer    .  Cerebral palsy    . Heart attack    . Stroke      DRUG ALLERGIES:   Allergies  Allergen Reactions  . Ace Inhibitors     Other reaction(s): Cough    REVIEW OF SYSTEMS:  Review of Systems  Constitutional: Positive for fever and malaise/fatigue. Negative for chills and weight loss.  HENT: Negative for ear discharge, ear pain and nosebleeds.   Eyes: Negative for blurred vision, pain and discharge.  Respiratory: Negative for sputum production, shortness of breath, wheezing and stridor.   Cardiovascular: Negative for chest pain, palpitations, orthopnea and PND.  Gastrointestinal: Negative for abdominal pain, diarrhea, nausea and vomiting.  Genitourinary: Positive for dysuria and urgency. Negative for frequency.  Musculoskeletal: Negative for back pain and joint pain.  Neurological: Positive for weakness. Negative for sensory change, speech change and focal weakness.  Psychiatric/Behavioral: Negative for depression and hallucinations. The patient is not nervous/anxious.   All other systems reviewed and are negative.    MEDICATIONS AT HOME:   Prior to Admission medications   Medication Sig Start Date End Date Taking? Authorizing Provider  aspirin EC 81 MG tablet Take 1 tablet (81 mg total) by mouth daily. 02/13/16  Yes Enid Baasadhika Kalisetti, MD  atorvastatin (LIPITOR) 20 MG tablet Take 20 mg by mouth daily.   Yes Historical Provider, MD  Calcium Carbonate-Vitamin D 600-400 MG-UNIT tablet Take 1 tablet by mouth daily.    Yes Historical  Provider, MD  citalopram (CELEXA) 20 MG tablet Take 20 mg by mouth daily.   Yes Historical Provider, MD  donepezil (ARICEPT) 10 MG tablet Take 10 mg by mouth at bedtime.  02/23/16 02/22/17 Yes Historical Provider, MD  ferrous sulfate 325 (65 FE) MG tablet Take 325 mg by mouth daily.   Yes Historical Provider, MD  furosemide (LASIX) 20 MG tablet Take 1 tablet (20 mg total) by mouth every other day. 02/13/16  Yes Enid Baas, MD  HYDROcodone-acetaminophen  (NORCO/VICODIN) 5-325 MG tablet Take 0.5-1 tablets by mouth 2 (two) times daily as needed for moderate pain.    Yes Historical Provider, MD  levothyroxine (SYNTHROID, LEVOTHROID) 88 MCG tablet Take 88 mcg by mouth daily before breakfast.   Yes Historical Provider, MD  Multiple Vitamin (MULTIVITAMIN) tablet Take 1 tablet by mouth daily.   Yes Historical Provider, MD  pantoprazole (PROTONIX) 40 MG tablet Take 1 tablet (40 mg total) by mouth 2 (two) times daily before a meal. 02/13/16  Yes Enid Baas, MD  potassium chloride SA (K-DUR,KLOR-CON) 20 MEQ tablet Take 2 tablets (40 mEq total) by mouth daily. Patient taking differently: Take 20 mEq by mouth 2 (two) times daily.  02/13/16  Yes Enid Baas, MD  primidone (MYSOLINE) 50 MG tablet Take 50 mg by mouth 2 (two) times daily.    Yes Historical Provider, MD  tolterodine (DETROL) 1 MG tablet Take 1 mg by mouth 2 (two) times daily.  04/26/16 04/26/17 Yes Historical Provider, MD  triamcinolone ointment (KENALOG) 0.1 % Apply 1 application topically 2 (two) times daily as needed.   Yes Historical Provider, MD      VITAL SIGNS:  Blood pressure (!) 106/49, pulse 60, temperature (!) 100.7 F (38.2 C), temperature source Rectal, resp. rate 15, height 5' (1.524 m), weight 174 lb 9.7 oz (79.2 kg), SpO2 98 %.  PHYSICAL EXAMINATION:  GENERAL:  82 y.o.-year-old patient lying in the bed with no acute distress.  EYES: Pupils equal, round, reactive to light and accommodation. No scleral icterus. Extraocular muscles intact.  HEENT: Head atraumatic, normocephalic. Oropharynx and nasopharynx clear. She is legally blind NECK:  Supple, no jugular venous distention. No thyroid enlargement, no tenderness.  LUNGS: Normal breath sounds bilaterally, no wheezing, rales,rhonchi or crepitation. No use of accessory muscles of respiration.  CARDIOVASCULAR: S1, S2 normal. No murmurs, rubs, or gallops.  ABDOMEN: Soft, nontender, nondistended. Bowel sounds present. No  organomegaly or mass.  EXTREMITIES: No pedal edema, cyanosis, or clubbing.  NEUROLOGIC: unable to assess secondary to her disability with hearing and being legally blindOverall nonfocal PSYCHIATRIC: The patient is alert and oriented x 3.  SKIN: No obvious rash, lesion, or ulcer.   LABORATORY PANEL:   CBC  Recent Labs Lab 05/16/16 0901  WBC 10.9  HGB 11.8*  HCT 34.1*  PLT 116*   ------------------------------------------------------------------------------------------------------------------  Chemistries   Recent Labs Lab 05/16/16 0901  NA 130*  K 4.1  CL 101  CO2 23  GLUCOSE 120*  BUN 18  CREATININE 1.16*  CALCIUM 8.9  AST 27  ALT 20  ALKPHOS 64  BILITOT 0.8   ------------------------------------------------------------------------------------------------------------------  Cardiac Enzymes  Recent Labs Lab 05/16/16 0901  TROPONINI 0.03*   ------------------------------------------------------------------------------------------------------------------  RADIOLOGY:  Dg Chest Port 1 View  Result Date: 05/16/2016 CLINICAL DATA:  Weakness for 3 days.  Hypertension. EXAM: PORTABLE CHEST 1 VIEW COMPARISON:  August 18, 2013 FINDINGS: There is no edema or consolidation. Heart size and pulmonary vascularity are normal. There is atherosclerotic calcification in the  aorta. There is degenerative change in each shoulder with superior migration of each humeral head. There is degenerative change in the thoracic spine. IMPRESSION: No edema or consolidation. Aortic atherosclerosis. Chronic rotator cuff tears bilaterally. Electronically Signed   By: Bretta BangWilliam  Woodruff III M.D.   On: 05/16/2016 09:30    EKG:   Normal sinus rhythm. Nonspecific T-wave inversion in lateral leads IMPRESSION AND PLAN:   Erin Snyder Masters  is a 82 y.o. female with a known history of CHF, CK B stage III, hyperlipidemia, hypertension comes to the emergency room brought in from home by her son with fever  and weakness. Patient is hard of hearing and is legally blind. She was very weak when EMS went to pick her up.  1. SIRS secondary to UTI -Admit to medical floor -IV fluids - IV Rocephin - Follow-up blood culture urine culture - Follow-up white count  2. Generalized weakness deconditioning - Physical therapy to see patient  3. Acute on chronic renal failure - Appears clinically dehydrated we'll give IV fluids  4. Hypertension hold all BP meds resumed prior to discharge once blood pressure was stable  5.hypothyroidism continue Synthroid  6. DVT prophylaxis subcutaneous Lovenox All the records are reviewed and case discussed with ED provider. Management plans discussed with the patient, family and they are in agreement.  CODE STATUS: DNR (has out of facility form-d/w son)  TOTAL TIME TAKING CARE OF THIS PATIENT:50 minutes.    Heitor Steinhoff M.D on 05/16/2016 at 11:47 AM  Between 7am to 6pm - Pager - [phone]  After 6pm go to [email or web address] - password [email or web address] [facility]  [facility]  Office  [phone]  CC: Primary care physician; Mickey FarberHIES, DAVID, MD

## 2016-05-16 NOTE — ED Provider Notes (Signed)
[facility] Emergency Department Provider Note   L5 caveat: Review of systems and history is limited by poor historian, deafness, weakness     Time seen: ----------------------------------------- 8:46 AM on 05/16/2016 -----------------------------------------    I have reviewed the triage vital signs and the nursing notes.   HISTORY  Chief Complaint No chief complaint on file.    HPI Shelbie Ammonsletha T Colver is a 82 y.o. female who presents to ER being brought from home for fever and weakness. Patient is very hard of hearing and is blind, she states she does not feel too bad. EMS reports they had to pick her up that she was very weak. Patient denies any other injuries or complaints at this time.   Past Medical History:  Diagnosis Date  . CHF (congestive heart failure) (HCC)   . CKD (chronic kidney disease), stage III   . HLD (hyperlipidemia)   . Hypertension   . Hypothyroidism     Patient Active Problem List   Diagnosis Date Noted  . Arthritis of shoulder 04/27/2016  . Chronic right shoulder pain 04/27/2016  . DNR (do not resuscitate) 04/26/2016  . Gastric ulcer without hemorrhage or perforation 02/24/2016  . Peptic ulcer of stomach   . Gastrointestinal hemorrhage 02/09/2016  . Chronic kidney disease, stage III (moderate) 02/09/2016  . HTN (hypertension) 02/09/2016  . Hyperlipidemia 02/09/2016  . Hypothyroidism (acquired) 02/09/2016  . Deep vein thrombosis (DVT) (HCC) 02/09/2016  . Acute deep vein thrombosis (DVT) of proximal vein of left lower extremity (HCC) 12/26/2015  . Glenohumeral arthritis 12/14/2015  . Non-rheumatic mitral regurgitation 10/10/2015  . Recurrent major depressive disorder, in full remission (HCC) 08/24/2015  . Late onset Alzheimer's disease without behavioral disturbance 07/26/2015  . Benign essential tremor 06/15/2015  . High risk medication use 12/13/2014  . Chronic hypokalemia 06/14/2014  . Blindness 06/13/2014  .  Depression 06/13/2014  . Eosinophil count raised 06/13/2014  . Benign essential hypertension 06/13/2014  . DDD (degenerative disc disease), lumbar 04/01/2014  . Lumbar radiculitis 04/01/2014  . Lumbar stenosis with neurogenic claudication 04/01/2014    Past Surgical History:  Procedure Laterality Date  . CHOLECYSTECTOMY    . ESOPHAGOGASTRODUODENOSCOPY (EGD) WITH PROPOFOL N/A 02/10/2016   Procedure: ESOPHAGOGASTRODUODENOSCOPY (EGD) WITH PROPOFOL;  Surgeon: Midge Miniumarren Wohl, MD;  Location: [facility] ENDOSCOPY;  Service: Endoscopy;  Laterality: N/A;    Allergies Ace inhibitors  Social History Social History  Substance Use Topics  . Smoking status: Never Smoker  . Smokeless tobacco: Never Used  . Alcohol use No    Review of Systems Constitutional: Positive for fever Cardiovascular: Negative for chest pain. Respiratory: Negative for shortness of breath. Gastrointestinal: Negative for abdominal pain, vomiting and diarrhea. Genitourinary: Negative for dysuria. Musculoskeletal: Negative for back pain. Skin: Negative for rash. Neurological: Negative for headaches, positive for generalized weakness  10-point ROS otherwise negative.  ____________________________________________   PHYSICAL EXAM:  VITAL SIGNS: ED Triage Vitals  Enc Vitals Group     BP      Pulse      Resp      Temp      Temp src      SpO2      Weight      Height      Head Circumference      Peak Flow      Pain Score      Pain Loc      Pain Edu?      Excl. in GC?     Constitutional: Alert,  no distress. Eyes: Conjunctivae are normal.  ENT   Head: Normocephalic and atraumatic.   Nose: No congestion/rhinnorhea.   Mouth/Throat: Mucous membranes are moist.   Neck: No stridor. Cardiovascular: Irregularly irregular rhythm. Systolic murmur is noted Respiratory: Normal respiratory effort without tachypnea nor retractions. Breath sounds are clear and equal bilaterally. No  wheezes/rales/rhonchi. Gastrointestinal: Soft and nontender. Normal bowel sounds Musculoskeletal: Nontender with normal range of motion in all extremities. No lower extremity tenderness nor edema. Neurologic:  Normal speech and language. No gross focal neurologic deficits are appreciated. Generalized weakness, nothing focal. Skin:  Skin is warm, dry and intact. No rash noted. Psychiatric: Mood and affect are normal.  ____________________________________________  EKG: Interpreted by me. Sinus tachycardia with rate of 111 bpm, normal PR interval, normal QRS width, normal QT interval., PVCs, low voltage, likely septal infarct  ____________________________________________  ED COURSE:  Pertinent labs & imaging results that were available during my care of the patient were reviewed by me and considered in my medical decision making (see chart for details). Clinical Course  Patient presents to ER with weakness and fever, we will initiate sepsis protocol.  Procedures ____________________________________________   LABS (pertinent positives/negatives)  Labs Reviewed  COMPREHENSIVE METABOLIC PANEL - Abnormal; Notable for the following:       Result Value   Sodium 130 (*)    Glucose, Bld 120 (*)    Creatinine, Ser 1.16 (*)    Total Protein 6.1 (*)    Albumin 2.8 (*)    GFR calc non Af Amer 40 (*)    GFR calc Af Amer 46 (*)    All other components within normal limits  TROPONIN I - Abnormal; Notable for the following:    Troponin I 0.03 (*)    All other components within normal limits  CBC WITH DIFFERENTIAL/PLATELET - Abnormal; Notable for the following:    Hemoglobin 11.8 (*)    HCT 34.1 (*)    RDW 14.8 (*)    Platelets 116 (*)    Neutro Abs 9.2 (*)    Lymphs Abs 0.4 (*)    Monocytes Absolute 1.3 (*)    All other components within normal limits  URINALYSIS COMPLETEWITH MICROSCOPIC ([facility] ONLY) - Abnormal; Notable for the following:    Color, Urine YELLOW (*)    APPearance CLOUDY  (*)    Hgb urine dipstick 2+ (*)    Protein, ur 100 (*)    Leukocytes, UA 3+ (*)    Bacteria, UA FEW (*)    Squamous Epithelial / LPF 0-5 (*)    All other components within normal limits  CULTURE, BLOOD (ROUTINE X 2)  CULTURE, BLOOD (ROUTINE X 2)  URINE CULTURE  LACTIC ACID, PLASMA  BLOOD GAS, VENOUS  LACTIC ACID, PLASMA   CRITICAL CARE Performed by: Emily Filbert   Total critical care time: 30 minutes  Critical care time was exclusive of separately billable procedures and treating other patients.  Critical care was necessary to treat or prevent imminent or life-threatening deterioration.  Critical care was time spent personally by me on the following activities: development of treatment plan with patient and/or surrogate as well as nursing, discussions with consultants, evaluation of patient's response to treatment, examination of patient, obtaining history from patient or surrogate, ordering and performing treatments and interventions, ordering and review of laboratory studies, ordering and review of radiographic studies, pulse oximetry and re-evaluation of patient's condition.  RADIOLOGY Images were viewed by me  Chest x-ray IMPRESSION: No edema or consolidation. Aortic  atherosclerosis. Chronic rotator cuff tears bilaterally.  ____________________________________________  FINAL ASSESSMENT AND PLAN  Weakness, fever, cystitis  Plan: Patient with labs and imaging as dictated above. Patient presents to the ER with profound weakness which appears to be from UTI. She's been started on sepsis protocol, received IV fluid boluses, IV antibiotics. She is too weak to walk and comes from home. I will discuss with the hospitalist for admission.   Emily FilbertWilliams, Nehemie Casserly E, MD   Note: This dictation was prepared with Dragon dictation. Any transcriptional errors that result from this process are unintentional    Emily FilbertJonathan E Danisha Brassfield, MD 05/16/16 1009

## 2016-05-16 NOTE — ED Triage Notes (Signed)
Pt here via EMS from home with c/o weakness since Sunday. Pt is blind and HOH. Hx of afib and monitor confirms same. Pt resting in bed with no c/o pain at this time. Sat 93% on RA, no shob noted.

## 2016-05-16 NOTE — ED Notes (Signed)
Floor returned call to receive report, [facility], Charity fundraiserN.

## 2016-05-16 NOTE — ED Notes (Signed)
Pt. Rolled to get on and off bedpan with no complications. Pt. Verbalized when finished urinating.

## 2016-05-16 NOTE — Evaluation (Signed)
Physical Therapy Evaluation Patient Details Name: Erin Snyder MRN: [id number] DOB: 05/30/1923 Today's Date: 05/16/2016   History of Present Illness  Pt is a 82 y/o female admitted with systemic inflammatory response syndrome. Pt brought to ED from home with complaints of fever and weakness possibly secondary to a UTI. Pt is legally blind and HOH, hears better on the L side. PMH includes: CHF, CKD (stage III), HTN, and A-fib.  Clinical Impression  Pt is a pleasant 82 y/o female who presents with generalized weakness. Pt is legally blind and HOH. PLOF: Pt able to ambulate at home using furniture and other landmarks to get around. Family reports they tried 2WRW in the past but was unsuccessful due to patient not being able to see what is in front of her. Family states [facility] been coming to house up since March and believe it has been very beneficial for her. Son mentions that pt has a chronic blood clot in L LE, not being treated by blood thinner due to internal bleeding incident, but they go to a vascular clinic once a month. Pt requires assistance for ADLs such as preparing meals and bathing, but is able to dress herself. Pt requires mod-max assist for bed mobility and mod assist for transfers. Mod assist to lift trunk off of bed and verbal cueing to decrease posterior lean and mod assist to scoot to EOB. Pt also requires mod assist for sit/stand, tactile cueing to place UE on RW upon standing. Pt continues to demonstrate posterior lean in standing and unable to correct due to confusion. Pt seemed confused throughout session, at times asking if she was still in the hospital. Suspect increased assistance with mobility is due to confusion in unfamiliar environment. Pt will benefit from skilled PT services in order to improve strength, balance, endurance, and functional mobility. At this time pt is appropriate for HHPT, as home will offer a more familiar and comfortable environment, which allows for  better participation.     Follow Up Recommendations Home health PT    Equipment Recommendations  None recommended by PT    Recommendations for Other Services       Precautions / Restrictions Precautions Precautions: Fall Restrictions Weight Bearing Restrictions: No      Mobility  Bed Mobility Overal bed mobility: Needs Assistance Bed Mobility: Supine to Sit;Sit to Supine     Supine to sit: Mod assist;Max assist Sit to supine: Max assist   General bed mobility comments: Pt requires mod-max assist for bed mobility. Verbal cueing to swing legs off of bed, assist required to lift trunk off of bed. Verbal cueing to lean forward and decreased posterior lean. Mod assist to scoot forward to EOB.  Transfers Overall transfer level: Needs assistance Equipment used: Rolling walker (2 wheeled) Transfers: Sit to/from Stand Sit to Stand: Mod assist         General transfer comment: Tactile and verbal cueing to reach for RW upon standing. Pt requires mod assist to stand and requires min assist to maintain standing balance, demonstrates posterior lean.   Ambulation/Gait             General Gait Details: Not attempted due to patient confusion  Stairs            Wheelchair Mobility    Modified Rankin (Stroke Patients Only)       Balance Overall balance assessment: Needs assistance Sitting-balance support: Bilateral upper extremity supported;Feet supported Sitting balance-Leahy Scale: Fair Sitting balance - Comments: Min assist  to maintain sitting balance and decrease posterior trunk lean. Postural control: Posterior lean Standing balance support: Bilateral upper extremity supported Standing balance-Leahy Scale: Fair Standing balance comment: Pt continues to demonstrate posterior trunk lean in standing, min assist to maintain standing balance with B UE support on RW.                             Pertinent Vitals/Pain Pain Assessment: No/denies pain     Home Living Family/patient expects to be discharged to:: Private residence Living Arrangements: Children Available Help at Discharge: Family;Personal care attendant Type of Home: House Home Access: Ramped entrance     Home Layout: One level Home Equipment: Grab bars - toilet;Shower seat Additional Comments: Pt has an aide come in M-F in the AM, son and daughter available to assist during evenings M-F and all day on weekends.     Prior Function Level of Independence: Needs assistance   Gait / Transfers Assistance Needed: Pt ambulates in home using furniture landmarks to get around  ADL's / Homemaking Assistance Needed: Pt is able to dress herself. Requires some assist from aide for bathing. Aide and family prepare meals.         Hand Dominance        Extremity/Trunk Assessment   Upper Extremity Assessment: Generalized weakness (UE at least 3/5, functional)           Lower Extremity Assessment: Generalized weakness (LE at least 3/5, functional )         Communication   Communication: HOH (hears better on L, blind)  Cognition Arousal/Alertness: Awake/alert Behavior During Therapy: WFL for tasks assessed/performed Overall Cognitive Status: Within Functional Limits for tasks assessed                      General Comments      Exercises        Assessment/Plan    PT Assessment Patient needs continued PT services  PT Diagnosis Generalized weakness;Difficulty walking   PT Problem List Decreased strength;Decreased range of motion;Decreased activity tolerance;Decreased balance;Decreased mobility;Decreased coordination;Decreased cognition;Decreased knowledge of use of DME;Decreased safety awareness;Pain  PT Treatment Interventions Gait training;Functional mobility training;Therapeutic activities;Therapeutic exercise;Balance training;Neuromuscular re-education;Patient/family education   PT Goals (Current goals can be found in the Care Plan section) Acute  Rehab PT Goals Patient Stated Goal: To return home PT Goal Formulation: With patient Time For Goal Achievement: 05/30/16 Potential to Achieve Goals: Good    Frequency Min 2X/week   Barriers to discharge        Co-evaluation               End of Session Equipment Utilized During Treatment: Gait belt Activity Tolerance: Patient tolerated treatment well;Other (comment) (limited by confusion, asking if she was still in hospital) Patient left: in bed;with call bell/phone within reach;with family/visitor present (Bed alarm not turning on, RN notified.) Nurse Communication: Mobility status;Other (comment) (Bed alarm not turning on)         Time: [id number] PT Time Calculation (min) (ACUTE ONLY): 18 min   Charges:         PT G Codes:        Thereasa ParkinShagun Latoshia Monrroy 05/16/2016, 4:40 PM  Thereasa ParkinShagun Lalania Haseman, SPT [phone]

## 2016-05-16 NOTE — Progress Notes (Signed)
Anticoagulation monitoring(Lovenox):  82yo female ordered Lovenox 40 mg Q24h  Filed Weights   05/16/16 0919  Weight: 174 lb 9.7 oz (79.2 kg)   Body mass index is 34.1 kg/m.    Lab Results  Component Value Date   CREATININE 1.16 (H) 05/16/2016   CREATININE 0.72 02/13/2016   CREATININE 1.12 (H) 02/10/2016   Estimated Creatinine Clearance: 29.4 mL/min (by C-G formula based on SCr of 1.16 mg/dL). Hemoglobin & Hematocrit     Component Value Date/Time   HGB 11.8 (L) 05/16/2016 0901   HCT 34.1 (L) 05/16/2016 0901     Per Protocol for Patient with estCrcl < 30 ml/min and BMI < 40, will transition to Lovenox 30 mg Q24h.

## 2016-05-17 LAB — BLOOD CULTURE ID PANEL (REFLEXED)
Acinetobacter baumannii: NOT DETECTED
CANDIDA GLABRATA: NOT DETECTED
CANDIDA PARAPSILOSIS: NOT DETECTED
CANDIDA TROPICALIS: NOT DETECTED
Candida albicans: NOT DETECTED
Candida krusei: NOT DETECTED
Carbapenem resistance: NOT DETECTED
ENTEROCOCCUS SPECIES: NOT DETECTED
ESCHERICHIA COLI: DETECTED — AB
Enterobacter cloacae complex: NOT DETECTED
Enterobacteriaceae species: DETECTED — AB
HAEMOPHILUS INFLUENZAE: NOT DETECTED
KLEBSIELLA PNEUMONIAE: NOT DETECTED
Klebsiella oxytoca: NOT DETECTED
LISTERIA MONOCYTOGENES: NOT DETECTED
METHICILLIN RESISTANCE: NOT DETECTED
Neisseria meningitidis: NOT DETECTED
PROTEUS SPECIES: NOT DETECTED
Pseudomonas aeruginosa: NOT DETECTED
SERRATIA MARCESCENS: NOT DETECTED
STREPTOCOCCUS PYOGENES: NOT DETECTED
Staphylococcus aureus (BCID): NOT DETECTED
Staphylococcus species: NOT DETECTED
Streptococcus agalactiae: NOT DETECTED
Streptococcus pneumoniae: NOT DETECTED
Streptococcus species: NOT DETECTED
VANCOMYCIN RESISTANCE: NOT DETECTED

## 2016-05-17 LAB — BASIC METABOLIC PANEL
Anion gap: 4 — ABNORMAL LOW (ref 5–15)
BUN: 18 mg/dL (ref 6–20)
CALCIUM: 8.3 mg/dL — AB (ref 8.9–10.3)
CO2: 26 mmol/L (ref 22–32)
CREATININE: 1.12 mg/dL — AB (ref 0.44–1.00)
Chloride: 106 mmol/L (ref 101–111)
GFR calc non Af Amer: 42 mL/min — ABNORMAL LOW (ref >60)
GFR, EST AFRICAN AMERICAN: 48 mL/min — AB (ref >60)
Glucose, Bld: 98 mg/dL (ref 65–99)
Potassium: 3.9 mmol/L (ref 3.5–5.1)
SODIUM: 136 mmol/L (ref 135–145)

## 2016-05-17 LAB — LACTIC ACID, PLASMA: LACTIC ACID, VENOUS: 0.8 mmol/L (ref 0.5–1.9)

## 2016-05-17 MED ORDER — SODIUM CHLORIDE 0.9 % IV SOLN
1.0000 g | Freq: Two times a day (BID) | INTRAVENOUS | Status: DC
  Administered 2016-05-17 – 2016-05-18 (×3): 1 g via INTRAVENOUS
  Filled 2016-05-17 (×6): qty 1

## 2016-05-17 MED ORDER — FUROSEMIDE 20 MG PO TABS
20.0000 mg | ORAL_TABLET | Freq: Every day | ORAL | Status: DC
  Administered 2016-05-17 – 2016-05-18 (×2): 20 mg via ORAL
  Filled 2016-05-17 (×2): qty 1

## 2016-05-17 NOTE — Care Management (Signed)
Admitted to this facility with the diagnosis of SIRS. Lives with son, Cindee Lameete, [phone]) and his wife. Hard of hearing and legally blind. Last seen Dr. Harrington Challengerhies about 2 weeks ago. Good appetite. Slipped out of chair Monday, no falls. Rolling walker, eheelchair, and cane in the home, if needed. No skilled facility. No home oxygen. Receives [facility] the [facility]. Self feed, needs help with dressing and baths. Prescriptions are filled through [facility] in [place] if short term, if long term [facility] per the mail. Family will transport.  Gwenette GreetBrenda S Allyssa Abruzzese RN MSN CCM Care Management [phone]

## 2016-05-17 NOTE — Progress Notes (Signed)
PHARMACY - PHYSICIAN COMMUNICATION CRITICAL VALUE ALERT - BLOOD CULTURE IDENTIFICATION (BCID)  No results found for this or any previous visit.   Lab called with [facility] result E. coli KPC(-)  Name of physician (or Provider) Contacted: Willis  Changes to prescribed antibiotics required: Changed abx to meropenem per nomogram and conversation with hospitalist.  Raine Elsass S 05/17/2016  1:12 AM

## 2016-05-17 NOTE — Progress Notes (Signed)
Rounding performed with Dr. Elpidio AnisSudini. Plan of care discussed with patient and caregiver. Bo McclintockBrewer,Jonel Sick S, RN

## 2016-05-17 NOTE — Progress Notes (Signed)
[facility] [place] [facility]   PATIENT NAME: Erin Snyder    MR#:  [id number]  DATE OF BIRTH:  02/13/1923  SUBJECTIVE:  CHIEF COMPLAINT:   Chief Complaint  Patient presents with  . Code Sepsis  . Weakness   Patient feels well. Denies pain. No shortness of breath.  Patient is able to take care of some activities of daily living on on at baseline.  REVIEW OF SYSTEMS:    Review of Systems  Constitutional: Negative for chills and fever.  HENT: Negative for sore throat.   Eyes: Negative for blurred vision, double vision and pain.  Respiratory: Negative for cough, hemoptysis, shortness of breath and wheezing.   Cardiovascular: Negative for chest pain, palpitations, orthopnea and leg swelling.  Gastrointestinal: Negative for abdominal pain, constipation, diarrhea, heartburn, nausea and vomiting.  Genitourinary: Negative for dysuria and hematuria.  Musculoskeletal: Negative for back pain and joint pain.  Skin: Negative for rash.  Neurological: Negative for sensory change, speech change, focal weakness and headaches.  Endo/Heme/Allergies: Does not bruise/bleed easily.  Psychiatric/Behavioral: Negative for depression. The patient is not nervous/anxious.     DRUG ALLERGIES:   Allergies  Allergen Reactions  . Ace Inhibitors     Other reaction(s): Cough    VITALS:  Blood pressure 139/63, pulse 70, temperature 98.4 F (36.9 C), temperature source Oral, resp. rate 18, height 5' (1.524 m), weight 79.2 kg (174 lb 9.7 oz), SpO2 95 %.  PHYSICAL EXAMINATION:   Physical Exam  GENERAL:  82 y.o.-year-old patient lying in the bed with no acute distress. Decreased hearing EYES: Pupils equal, round, reactive to light and accommodation. No scleral icterus. Extraocular muscles intact.  HEENT: Head atraumatic, normocephalic. Oropharynx and nasopharynx clear.  NECK:  Supple, no jugular venous distention. No thyroid enlargement, no tenderness.  LUNGS: Normal  work of breathing. Bilateral basal crackles. Good air entry. CARDIOVASCULAR: S1, S2 normal.  ABDOMEN: Soft, nontender, nondistended. Bowel sounds present. No organomegaly or mass.  EXTREMITIES: No cyanosis, clubbing b/l.   3+ lower extremity edema left greater than right NEUROLOGIC: Cranial nerves II through XII are intact. No focal Motor or sensory deficits b/l.   PSYCHIATRIC: The patient is alert and Awake SKIN: No obvious rash, lesion, or ulcer.   LABORATORY PANEL:   CBC  Recent Labs Lab 05/16/16 0901  WBC 10.9  HGB 11.8*  HCT 34.1*  PLT 116*   ------------------------------------------------------------------------------------------------------------------ Chemistries   Recent Labs Lab 05/16/16 0901 05/17/16 0448  NA 130* 136  K 4.1 3.9  CL 101 106  CO2 23 26  GLUCOSE 120* 98  BUN 18 18  CREATININE 1.16* 1.12*  CALCIUM 8.9 8.3*  AST 27  --   ALT 20  --   ALKPHOS 64  --   BILITOT 0.8  --    ------------------------------------------------------------------------------------------------------------------  Cardiac Enzymes  Recent Labs Lab 05/16/16 0901  TROPONINI 0.03*   ------------------------------------------------------------------------------------------------------------------  RADIOLOGY:  Dg Chest Port 1 View  Result Date: 05/16/2016 CLINICAL DATA:  Weakness for 3 days.  Hypertension. EXAM: PORTABLE CHEST 1 VIEW COMPARISON:  August 18, 2013 FINDINGS: There is no edema or consolidation. Heart size and pulmonary vascularity are normal. There is atherosclerotic calcification in the aorta. There is degenerative change in each shoulder with superior migration of each humeral head. There is degenerative change in the thoracic spine. IMPRESSION: No edema or consolidation. Aortic atherosclerosis. Chronic rotator cuff tears bilaterally. Electronically Signed   By: Bretta BangWilliam  Woodruff III M.D.   On: 05/16/2016 09:30  ASSESSMENT AND PLAN:   Erin Snyder   is a 82 y.o. female with a known history of CHF, CK B stage III, hyperlipidemia, hypertension comes to the emergency room brought in from home by her son with fever and weakness. Patient is hard of hearing and is legally blind. She was very weak when EMS went to pick her up.  * Sepsis with Escherichia coli bacteremia source being UTI, associated acute encephalopathy Continue meropenem Wait for final culture and sensitivities Lactic acid has normalized  * Generalized weakness deconditioning - Physical therapy to see patient  * Chronic lower extremity edema Left greater than right due to left lower extremity DVT. Recent ultrasound of left lower leg showed no DVT. Eliquis was stopped during last admission due to GI bleed. Family did not want an IVC filter at that time. Restart home dose of Lasix.  * Hypertension hold all BP meds   * hypothyroidism continue Synthroid  * DVT prophylaxis subcutaneous Lovenox  All the records are reviewed and case discussed with Care Management/Social Workerr. Management plans discussed with the patient, family and they are in agreement.  CODE STATUS: DNR  DVT Prophylaxis: SCDs  TOTAL TIME TAKING CARE OF THIS PATIENT: 35 minutes.   POSSIBLE D/C IN 2-3 DAYS, DEPENDING ON CLINICAL CONDITION.  Milagros LollSudini, Dereck Agerton R M.D on 05/17/2016 at 12:31 PM  Between 7am to 6pm - Pager - [phone]  After 6pm go to [email or web address] - password [email or web address] [facility]  Office  [phone]  CC: Primary care physician; Mickey FarberHIES, DAVID, MD  Note: This dictation was prepared with Dragon dictation along with smaller phrase technology. Any transcriptional errors that result from this process are unintentional.

## 2016-05-17 NOTE — Care Management Important Message (Signed)
Important Message  Patient Details  Name: Erin Snyder MRN: [id number] Date of Birth: 06/06/1923   Medicare Important Message Given:  Yes    Gwenette GreetBrenda S Willean Schurman, RN 05/17/2016, 8:26 AM

## 2016-05-17 NOTE — Progress Notes (Signed)
Physical Therapy Treatment Patient Details Name: Erin Snyder MRN: [id number] DOB: 01/29/1923 Today's Date: 05/17/2016    History of Present Illness Pt is a 82 y/o female admitted with systemic inflammatory response syndrome. Pt brought to ED from home with complaints of fever and weakness possibly secondary to a UTI. Pt is legally blind and HOH, hears better on the L side. PMH includes: CHF, CKD (stage III), HTN, and A-fib.    PT Comments    Erin Snyder is progressing towards her goals. She is now min assist with all functional mobility requiring tactile and verbal cuing for safe use of RW and for navigation due to vision impairment. Pt is HOH, but is pleasant, cooperative, and compliant with commands. She is only mildly confused, mostly about where she is. Pt would benefit from hearing aid and vision therapy. Pt is appropriate for continued PT at this time to address deficits in strength, balance, coordination, ROM, pain, endurance, gait, activity tolerance, safety awareness, and safe use of DME.    Follow Up Recommendations  Home health PT     Equipment Recommendations  None recommended by PT    Recommendations for Other Services       Precautions / Restrictions Precautions Precautions: Fall Restrictions Weight Bearing Restrictions: No    Mobility  Bed Mobility Overal bed mobility: Needs Assistance Bed Mobility: Supine to Sit;Sit to Supine     Supine to sit: Min assist Sit to supine: Min assist   General bed mobility comments: Pt is nearly independent with bed mobility requiring tactile cues and increased time/effort with min assist to scoot to EOB  Transfers Overall transfer level: Needs assistance Equipment used: Rolling walker (2 wheeled) Transfers: Sit to/from Stand Sit to Stand: Min assist         General transfer comment: Tactile and verbal cueing to reach for RW upon standing. Pt requires in assist due to insuficient forward lean    Ambulation/Gait Ambulation/Gait assistance: Min assist Ambulation Distance (Feet): 80 Feet Assistive device: Rolling walker (2 wheeled) Gait Pattern/deviations: Step-through pattern;Shuffle;Wide base of support;Drifts right/left   Gait velocity interpretation: <1.8 ft/sec, indicative of risk for recurrent falls General Gait Details: Pt ambulated with RW with fair balance and very slow gait speed; she requires verbal and tactile cues through RW for direction due to vision impairment    Stairs            Wheelchair Mobility    Modified Rankin (Stroke Patients Only)       Balance Overall balance assessment: Needs assistance Sitting-balance support: No upper extremity supported;Feet supported Sitting balance-Leahy Scale: Good Sitting balance - Comments: Good sitting balance, can tolerate min challange    Standing balance support: Bilateral upper extremity supported Standing balance-Leahy Scale: Fair Standing balance comment: Pt maintains balance without assist, but requires CGA for safety                     Cognition Arousal/Alertness: Awake/alert Behavior During Therapy: WFL for tasks assessed/performed Overall Cognitive Status: Within Functional Limits for tasks assessed       Memory: Decreased short-term memory              Exercises Other Exercises Other Exercises: Therepeutic activity including transfer on and off commode in bathroom with tactile cues for safe use of grab bars and RW for transfer and for self hygiene X 10 minutes    General Comments        Pertinent Vitals/Pain Pain Assessment: No/denies pain  Home Living Family/patient expects to be discharged to:: Private residence Living Arrangements: Children Available Help at Discharge: Family;Personal care attendant                Prior Function            PT Goals (current goals can now be found in the care plan section) Acute Rehab PT Goals Patient Stated Goal: To  return home PT Goal Formulation: With patient Time For Goal Achievement: 05/30/16 Potential to Achieve Goals: Good Progress towards PT goals: Progressing toward goals    Frequency  Min 2X/week    PT Plan Current plan remains appropriate    Co-evaluation             End of Session Equipment Utilized During Treatment: Gait belt Activity Tolerance: Patient tolerated treatment well;Other (comment) Patient left: in bed;with call bell/phone within reach;with family/visitor present     Time: [id number] PT Time Calculation (min) (ACUTE ONLY): 24 min  Charges:                       G Codes:      Ronan Dion 05/17/2016, 5:10 PM  Cassell Smilesevan M Bruin Bolger, SPT [phone]

## 2016-05-18 LAB — URINE CULTURE: Culture: >=100000 — AB

## 2016-05-18 LAB — CREATININE, SERUM
CREATININE: 0.9 mg/dL (ref 0.44–1.00)
GFR calc Af Amer: >60 mL/min (ref >60)
GFR, EST NON AFRICAN AMERICAN: 54 mL/min — AB (ref >60)

## 2016-05-18 MED ORDER — CEFUROXIME AXETIL 500 MG PO TABS: 500.0000 mg | ORAL_TABLET | Freq: Two times a day (BID) | ORAL | 0 refills | Status: DC

## 2016-05-18 MED ORDER — ENOXAPARIN SODIUM 40 MG/0.4ML ~~LOC~~ SOLN: 40.0000 mg | SUBCUTANEOUS | Status: DC

## 2016-05-18 NOTE — Progress Notes (Signed)
Discharge instructions given and went over with patient, son, and caregiver at bedside. Prescription given. All questions answered. Patient discharged home with family via wheelchair by volunteer services. Bo McclintockBrewer,Dovber Ernest S, RN

## 2016-05-18 NOTE — Progress Notes (Signed)
Physical Therapy Treatment Patient Details Name: Erin Snyder MRN: [id number] DOB: 10/29/1922 Today's Date: 05/18/2016    History of Present Illness Pt is a 82 y/o female admitted with systemic inflammatory response syndrome. Pt brought to ED from home with complaints of fever and weakness possibly secondary to a UTI. Pt is legally blind and HOH, hears better on the L side. PMH includes: CHF, CKD (stage III), HTN, and A-fib.    PT Comments    Pt is progressing towards goals with increased gait training distance of 160' with RW; pt had  fair balance and very slow gait speed; she requires verbal and tactile cues for direction due to vision impairment. She has a flexed posture and holds her RW too far forward with outstretched arms because she is not familiar with the environment; she would likely benefit from low vision training with a long cane to improve gait speed and mechanics. Pt tolerated increased ther-ex and received therapeutic activity training for use of bedside commode and independent bed mobility. Pt is appropriate for continued PT at this time to address deficits in strength, balance, coordination, endurance, gait, activity tolerance, safety awareness, and safe use of DME.    Follow Up Recommendations  Home health PT     Equipment Recommendations  None recommended by PT    Recommendations for Other Services       Precautions / Restrictions Precautions Precautions: Fall Restrictions Weight Bearing Restrictions: No    Mobility  Bed Mobility Overal bed mobility: Needs Assistance Bed Mobility: Supine to Sit;Sit to Supine     Supine to sit: Min assist Sit to supine: Min assist   General bed mobility comments: Pt is nearly independent with bed mobility requiring tactile cues and increased time/effort with min assist to scoot to EOB; pt's sitter reports she needs more help than usual due to the unfamiliar environment and that she will need less assist at  home  Transfers Overall transfer level: Needs assistance Equipment used: Rolling walker (2 wheeled) Transfers: Sit to/from Stand Sit to Stand: Min assist         General transfer comment: Tactile and verbal cueing to reach for RW upon standing. Pt requires in assist due to insuficient forward lean, mild improvement with verbal cues  Ambulation/Gait Ambulation/Gait assistance: Min assist Ambulation Distance (Feet): 160 Feet Assistive device: Rolling walker (2 wheeled) Gait Pattern/deviations: Step-through pattern;Decreased stride length;Shuffle;Trunk flexed;Drifts right/left Gait velocity: slow due to low confidence and impaired vision Gait velocity interpretation: <1.8 ft/sec, indicative of risk for recurrent falls General Gait Details: Pt ambulated with RW with fair balance and very slow gait speed; she requires verbal and tactile cues through RW for direction due to vision impairment. She has a lexed posture and holds her RW farther forward with outstretched arms because she is not familiar with the environment, she would likely benefit from low vision training with a long cane     Stairs            Wheelchair Mobility    Modified Rankin (Stroke Patients Only)       Balance Overall balance assessment: Needs assistance Sitting-balance support: No upper extremity supported;Feet supported Sitting balance-Leahy Scale: Good Sitting balance - Comments: Good sitting balance, can tolerate min challange    Standing balance support: Bilateral upper extremity supported Standing balance-Leahy Scale: Fair Standing balance comment: Pt maintains balance without assist, but requires CGA for safety  Cognition Arousal/Alertness: Awake/alert Behavior During Therapy: WFL for tasks assessed/performed Overall Cognitive Status: Within Functional Limits for tasks assessed       Memory: Decreased short-term memory              Exercises Total Joint  Exercises Ankle Circles/Pumps: 15 reps;Both;Strengthening;Supine (with manual reistance and verbal/ tactile cuing) Hip ABduction/ADduction: 15 reps;Both;AROM;Supine (with manual reistance and verbal/ tactile cuing) Straight Leg Raises: 15 reps;Both;AROM;Supine (with manual reistance and verbal/ tactile cuing) Other Exercises Other Exercises: Therepeutic activity including transfer on and off bedside commode with tactile cues for safe use of grab bars and RW for transfer and for self hygiene X 10 minutes    General Comments        Pertinent Vitals/Pain Pain Assessment: No/denies pain    Home Living                      Prior Function            PT Goals (current goals can now be found in the care plan section) Acute Rehab PT Goals Patient Stated Goal: To return home PT Goal Formulation: With patient Time For Goal Achievement: 05/30/16 Potential to Achieve Goals: Good Progress towards PT goals: Progressing toward goals    Frequency  Min 2X/week    PT Plan Current plan remains appropriate    Co-evaluation             End of Session Equipment Utilized During Treatment: Gait belt Activity Tolerance: Patient tolerated treatment well;Other (comment) Patient left: in bed;with call bell/phone within reach;with family/visitor present     Time: 1610-9604 PT Time Calculation (min) (ACUTE ONLY): 29 min  Charges:                       G Codes:      Tiffancy Moger 26-May-2016, 10:20 AM  Cassell Smiles, SPT [phone]

## 2016-05-18 NOTE — Progress Notes (Signed)
Anticoagulation monitoring(Lovenox):  82yo female ordered Lovenox 40 mg Q24h  Filed Weights   05/16/16 0919  Weight: 174 lb 9.7 oz (79.2 kg)   Body mass index is 34.1 kg/m.    Lab Results  Component Value Date   CREATININE 0.90 05/18/2016   CREATININE 1.12 (H) 05/17/2016   CREATININE 1.16 (H) 05/16/2016   Estimated Creatinine Clearance: 37.9 mL/min (by C-G formula based on SCr of 0.9 mg/dL). Hemoglobin & Hematocrit     Component Value Date/Time   HGB 11.8 (L) 05/16/2016 0901   HCT 34.1 (L) 05/16/2016 0901     Per Protocol for Patient with estCrcl > 30 ml/min and BMI < 40, will transition to Lovenox 40 mg Q24h.

## 2016-05-18 NOTE — Discharge Summary (Signed)
[facility] [place] [facility]   PATIENT NAME: Erin Snyder    MR#:  [id number]  DATE OF BIRTH:  September 23, 1923  DATE OF ADMISSION:  05/16/2016 ADMITTING PHYSICIAN: Enedina Finner, MD  DATE OF DISCHARGE: 05/18/2016  PRIMARY CARE PHYSICIAN: Harrington Challenger, DAVID, MD    ADMISSION DIAGNOSIS:  Weakness [R53.1] UTI (lower urinary tract infection) [N39.0] Elevated troponin [R79.89]  DISCHARGE DIAGNOSIS:  Active Problems:   SIRS (systemic inflammatory response syndrome) (HCC)   SECONDARY DIAGNOSIS:   Past Medical History:  Diagnosis Date  . CHF (congestive heart failure) (HCC)   . CKD (chronic kidney disease), stage III   . HLD (hyperlipidemia)   . Hypertension   . Hypothyroidism     HOSPITAL COURSE:   82 year old female with past medical history of dementia, chronic kidney disease stage III, history of CHF, hypertension, hyperlipidemia, legal blindness who presented to the hospital due to weakness and noted to have sepsis secondary to UTI.  1. Sepsis-this was secondary to urinary tract infection. Patient had a Escherichia coli urinary tract infection. -Initially patient was started on IV meropenem but the cultures came back to be Escherichia coli which was not ESBL. -Patient's blood cultures were also positive for Escherichia coli. -She is currently afebrile and hemodynamically stable and therefore being discharged on oral cefuroxime for additional 12 days to finish 14 days of therapy.   2. Urinary tract infection-this was the cause of patient's sepsis. -Patient's urine cultures were positive for Escherichia coli and it was sensitive to multiple antibiotics. In the hospital patient was treated with IV meropenem and not being discharged on oral cefuroxime.  3. Generalized weakness-secondary to sepsis and UTI. -Much improved, seen by physical therapy and recommended home health services which was arranged prior to discharge.  4. Chronic lower extremity edema Left greater  than right due to left lower extremity DVT. Recent ultrasound of left lower leg showed no DVT. Eliquis was stopped during last admission due to GI bleed. Family did not want an IVC filter at that time. Restart home dose of Lasix.  5. Dementia-patient will resume her Aricept.  6. Hypothyroidism-patient will resume her Synthroid.  Pt. Is being discharged home with home health PT, RN.   DISCHARGE CONDITIONS:   Stable  CONSULTS OBTAINED:    DRUG ALLERGIES:   Allergies  Allergen Reactions  . Ace Inhibitors     Other reaction(s): Cough    DISCHARGE MEDICATIONS:     Medication List    TAKE these medications   aspirin EC 81 MG tablet Take 1 tablet (81 mg total) by mouth daily.   atorvastatin 20 MG tablet Commonly known as:  LIPITOR Take 20 mg by mouth daily.   Calcium Carbonate-Vitamin D 600-400 MG-UNIT tablet Take 1 tablet by mouth daily.   cefUROXime 500 MG tablet Commonly known as:  CEFTIN Take 1 tablet (500 mg total) by mouth 2 (two) times daily with a meal.   citalopram 20 MG tablet Commonly known as:  CELEXA Take 20 mg by mouth daily.   donepezil 10 MG tablet Commonly known as:  ARICEPT Take 10 mg by mouth at bedtime.   ferrous sulfate 325 (65 FE) MG tablet Take 325 mg by mouth daily.   furosemide 20 MG tablet Commonly known as:  LASIX Take 1 tablet (20 mg total) by mouth every other day.   HYDROcodone-acetaminophen 5-325 MG tablet Commonly known as:  NORCO/VICODIN Take 0.5-1 tablets by mouth 2 (two) times daily as needed for moderate pain.  levothyroxine 88 MCG tablet Commonly known as:  SYNTHROID, LEVOTHROID Take 88 mcg by mouth daily before breakfast.   multivitamin tablet Take 1 tablet by mouth daily.   pantoprazole 40 MG tablet Commonly known as:  PROTONIX Take 1 tablet (40 mg total) by mouth 2 (two) times daily before a meal.   potassium chloride SA 20 MEQ tablet Commonly known as:  K-DUR,KLOR-CON Take 2 tablets (40 mEq total) by  mouth daily. What changed:  how much to take  when to take this   primidone 50 MG tablet Commonly known as:  MYSOLINE Take 50 mg by mouth 2 (two) times daily.   tolterodine 1 MG tablet Commonly known as:  DETROL Take 1 mg by mouth 2 (two) times daily.   triamcinolone ointment 0.1 % Commonly known as:  KENALOG Apply 1 application topically 2 (two) times daily as needed.         DISCHARGE INSTRUCTIONS:   DIET:  Cardiac diet  DISCHARGE CONDITION:  Stable  ACTIVITY:  Activity as tolerated  OXYGEN:  Home Oxygen: No.   Oxygen Delivery: room air  DISCHARGE LOCATION:  Home with Home Health Pt, RN.    If you experience worsening of your admission symptoms, develop shortness of breath, life threatening emergency, suicidal or homicidal thoughts you must seek medical attention immediately by calling 911 or calling your MD immediately  if symptoms less severe.  You Must read complete instructions/literature along with all the possible adverse reactions/side effects for all the Medicines you take and that have been prescribed to you. Take any new Medicines after you have completely understood and accpet all the possible adverse reactions/side effects.   Please note  You were cared for by a hospitalist during your hospital stay. If you have any questions about your discharge medications or the care you received while you were in the hospital after you are discharged, you can call the unit and asked to speak with the hospitalist on call if the hospitalist that took care of you is not available. Once you are discharged, your primary care physician will handle any further medical issues. Please note that NO REFILLS for any discharge medications will be authorized once you are discharged, as it is imperative that you return to your primary care physician (or establish a relationship with a primary care physician if you do not have one) for your aftercare needs so that they can reassess  your need for medications and monitor your lab values.     Today   Mental status much improved, weakness improved. Urine cultures positive for E. Coli but not ESBL.   VITAL SIGNS:  Blood pressure (!) 168/75, pulse 69, temperature 98.3 F (36.8 C), temperature source Oral, resp. rate 18, height 5' (1.524 m), weight 79.2 kg (174 lb 9.7 oz), SpO2 96 %.  I/O:   Intake/Output Summary (Last 24 hours) at 05/18/16 1447 Last data filed at 05/18/16 0040  Gross per 24 hour  Intake              240 ml  Output              250 ml  Net              -10 ml    PHYSICAL EXAMINATION:  GENERAL:  82 y.o.-year-old patient lying in the bed in no acute distress.  EYES: Legally blind HEENT: Head atraumatic, normocephalic. Oropharynx and nasopharynx clear.  NECK:  Supple, no jugular venous distention. No thyroid enlargement, no  tenderness.  LUNGS: Normal breath sounds bilaterally, no wheezing, rales,rhonchi. No use of accessory muscles of respiration.  CARDIOVASCULAR: S1, S2 normal. No murmurs, rubs, or gallops.  ABDOMEN: Soft, non-tender, non-distended. Bowel sounds present. No organomegaly or mass.  EXTREMITIES: No pedal edema, cyanosis, or clubbing.  NEUROLOGIC: Cranial nerves II through XII are intact. No focal motor or sensory defecits b/l. Globally weak. PSYCHIATRIC: The patient is alert and oriented x 3. Good affect.  SKIN: No obvious rash, lesion, or ulcer.   DATA REVIEW:   CBC  Recent Labs Lab 05/16/16 0901  WBC 10.9  HGB 11.8*  HCT 34.1*  PLT 116*    Chemistries   Recent Labs Lab 05/16/16 0901 05/17/16 0448 05/18/16 0407  NA 130* 136  --   K 4.1 3.9  --   CL 101 106  --   CO2 23 26  --   GLUCOSE 120* 98  --   BUN 18 18  --   CREATININE 1.16* 1.12* 0.90  CALCIUM 8.9 8.3*  --   AST 27  --   --   ALT 20  --   --   ALKPHOS 64  --   --   BILITOT 0.8  --   --     Cardiac Enzymes  Recent Labs Lab 05/16/16 0901  TROPONINI 0.03*    Microbiology Results  Results  for orders placed or performed during the hospital encounter of 05/16/16  Urine culture     Status: Abnormal   Collection Time: 05/16/16  9:00 AM  Result Value Ref Range Status   Specimen Description URINE, RANDOM  Final   Special Requests NONE  Final   Culture >=100,000 COLONIES/mL ESCHERICHIA COLI (A)  Final   Report Status 05/18/2016 FINAL  Final   Organism ID, Bacteria ESCHERICHIA COLI (A)  Final      Susceptibility   Escherichia coli - MIC*    AMPICILLIN >=32 RESISTANT Resistant     CEFAZOLIN 16 SENSITIVE Sensitive     CEFTRIAXONE <=1 SENSITIVE Sensitive     CIPROFLOXACIN <=0.25 SENSITIVE Sensitive     GENTAMICIN <=1 SENSITIVE Sensitive     IMIPENEM <=0.25 SENSITIVE Sensitive     NITROFURANTOIN <=16 SENSITIVE Sensitive     TRIMETH/SULFA <=20 SENSITIVE Sensitive     AMPICILLIN/SULBACTAM >=32 RESISTANT Resistant     PIP/TAZO >=128 RESISTANT Resistant     Extended ESBL NEGATIVE Sensitive     * >=100,000 COLONIES/mL ESCHERICHIA COLI  Blood Culture (routine x 2)     Status: Abnormal (Preliminary result)   Collection Time: 05/16/16  9:01 AM  Result Value Ref Range Status   Specimen Description BLOOD RIGHT WRIST  Final   Special Requests BOTTLES DRAWN AEROBIC AND ANAEROBIC 2CCAERO,2CCANA  Final   Culture  Setup Time   Final    GRAM NEGATIVE RODS IN BOTH AEROBIC AND ANAEROBIC BOTTLES CRITICAL RESULT CALLED TO, READ BACK BY AND VERIFIED WITH: MATT MCBANE @ 0011 ON 8//24/2017 BY CAF Organism ID to follow    Culture (A)  Final    ESCHERICHIA COLI SUSCEPTIBILITIES TO FOLLOW Performed at [facility]    Report Status PENDING  Incomplete  Blood Culture (routine x 2)     Status: None (Preliminary result)   Collection Time: 05/16/16  9:01 AM  Result Value Ref Range Status   Specimen Description BLOOD LEFT HAND  Final   Special Requests   Final    BOTTLES DRAWN AEROBIC AND ANAEROBIC 7CCAERO,10CCANA   Culture  Setup Time  Final    GRAM NEGATIVE RODS IN BOTH AEROBIC AND  ANAEROBIC BOTTLES CRITICAL RESULT CALLED TO, READ BACK BY AND VERIFIED WITH: MATT MCBANE @ 0011 ON 05/17/2016 BY CAF    Culture GRAM NEGATIVE RODS  Final   Report Status PENDING  Incomplete  Blood Culture ID Panel (Reflexed)     Status: Abnormal   Collection Time: 05/16/16  9:01 AM  Result Value Ref Range Status   Enterococcus species NOT DETECTED NOT DETECTED Final   Vancomycin resistance NOT DETECTED NOT DETECTED Final   Listeria monocytogenes NOT DETECTED NOT DETECTED Final   Staphylococcus species NOT DETECTED NOT DETECTED Final   Staphylococcus aureus NOT DETECTED NOT DETECTED Final   Methicillin resistance NOT DETECTED NOT DETECTED Final   Streptococcus species NOT DETECTED NOT DETECTED Final   Streptococcus agalactiae NOT DETECTED NOT DETECTED Final   Streptococcus pneumoniae NOT DETECTED NOT DETECTED Final   Streptococcus pyogenes NOT DETECTED NOT DETECTED Final   Acinetobacter baumannii NOT DETECTED NOT DETECTED Final   Enterobacteriaceae species DETECTED (A) NOT DETECTED Final    Comment: CRITICAL RESULT CALLED TO, READ BACK BY AND VERIFIED WITH: MATT MCBANE @ 0011 ON 05/17/2016 BY CAF    Enterobacter cloacae complex NOT DETECTED NOT DETECTED Final   Escherichia coli DETECTED (A) NOT DETECTED Final    Comment: CRITICAL RESULT CALLED TO, READ BACK BY AND VERIFIED WITH: MATT MCBANE @ 0011 ON 05/17/2016 BY CAF    Klebsiella oxytoca NOT DETECTED NOT DETECTED Final   Klebsiella pneumoniae NOT DETECTED NOT DETECTED Final   Proteus species NOT DETECTED NOT DETECTED Final   Serratia marcescens NOT DETECTED NOT DETECTED Final   Carbapenem resistance NOT DETECTED NOT DETECTED Final   Haemophilus influenzae NOT DETECTED NOT DETECTED Final   Neisseria meningitidis NOT DETECTED NOT DETECTED Final   Pseudomonas aeruginosa NOT DETECTED NOT DETECTED Final   Candida albicans NOT DETECTED NOT DETECTED Final   Candida glabrata NOT DETECTED NOT DETECTED Final   Candida krusei NOT DETECTED  NOT DETECTED Final   Candida parapsilosis NOT DETECTED NOT DETECTED Final   Candida tropicalis NOT DETECTED NOT DETECTED Final    RADIOLOGY:  No results found.    Management plans discussed with the patient, family and they are in agreement.  CODE STATUS:     Code Status Orders        Start     Ordered   05/16/16 1246  Do not attempt resuscitation (DNR)  Continuous    Question Answer Comment  In the event of cardiac or respiratory ARREST Do not call a "code blue"   In the event of cardiac or respiratory ARREST Do not perform Intubation, CPR, defibrillation or ACLS   In the event of cardiac or respiratory ARREST Use medication by any route, position, wound care, and other measures to relive pain and suffering. May use oxygen, suction and manual treatment of airway obstruction as needed for comfort.      05/16/16 1245    Code Status History    Date Active Date Inactive Code Status Order ID Comments User Context   02/09/2016 11:38 PM 02/13/2016  7:15 PM DNR [id number]  Oralia Manis, MD ED    Advance Directive Documentation   Flowsheet Row Most Recent Value  Type of Advance Directive  Advance instruction for mental health treatment  Pre-existing out of facility DNR order (yellow form or pink MOST form)  No data  "MOST" Form in Place?  No data      TOTAL  TIME TAKING CARE OF THIS PATIENT: 40 minutes.    Houston SirenSAINANI,VIVEK J M.D on 05/18/2016 at 2:47 PM  Between 7am to 6pm - Pager - [phone]  After 6pm go to [email or web address] - Social research officer, [email or web address] [email or web address]  [facility] [place] [facility]  Office  [phone]  CC: Primary care physician; Mickey FarberHIES, DAVID, MD

## 2016-05-18 NOTE — Care Management (Signed)
Discharge to home today per D. Saniani. [facility] Bedside commode per [facility]. Family will transport. Gwenette GreetBrenda S Willie Loy RN MSN CCM Care Management [phone]

## 2016-05-19 LAB — CULTURE, BLOOD (ROUTINE X 2)

## 2016-05-21 LAB — BLOOD GAS, VENOUS
ACID-BASE DEFICIT: 0.1 mmol/L (ref 0.0–2.0)
Bicarbonate: 25.4 mEq/L (ref 21.0–28.0)
PCO2 VEN: 44 mmHg (ref 44.0–60.0)
PH VEN: 7.37 (ref 7.320–7.430)
Patient temperature: 37

## 2016-06-08 ENCOUNTER — Ambulatory Visit (INDEPENDENT_AMBULATORY_CARE_PROVIDER_SITE_OTHER): Payer: Medicare Other | Admitting: Urology

## 2016-06-08 ENCOUNTER — Encounter: Payer: Self-pay | Admitting: Urology

## 2016-06-08 VITALS — BP 146/72 | HR 80 | Ht 60.0 in | Wt 157.9 lb

## 2016-06-08 DIAGNOSIS — R350 Frequency of micturition: Secondary | ICD-10-CM

## 2016-06-08 DIAGNOSIS — N952 Postmenopausal atrophic vaginitis: Secondary | ICD-10-CM

## 2016-06-08 DIAGNOSIS — R32 Unspecified urinary incontinence: Secondary | ICD-10-CM | POA: Diagnosis not present

## 2016-06-08 NOTE — Progress Notes (Signed)
06/08/2016 11:46 AM   Erin AmmonsAletha T Snyder 07/23/1923 [id number]  Referring provider: Mickey Farberavid Thies, MD [address] [facility] [place], [place] [id number]  Chief Complaint  Patient presents with  . Vaginal Atrophy    1 month follow up   . Urinary Frequency    HPI: Patient is a 82 year old Caucasian female who presents with her caregiver for a one month follow up for incontinence, frequency and vaginal atrophy.    Background history Patient was a referral from by Dr. Audelia Actonheis for urinary frequency.   She is very hard of hearing and it is difficult to obtain a history.   She states that as soon as she sits down from going to the bathroom, she has to go to the bathroom again.  This suddenly started the first of August.  She is having urgency, nocturia x 1-2 and urinary leakage.  She is going through 6 pads daily.  She denies dysuria, suprapubic pain and gross hematuria.  She has not had fevers, chills, nausea and/or vomiting.  She is drinking sodas and coffee through out the day.  Dr. Audelia Actonheis recently started Detrol 1 mg bid two weeks ago.  She does not have a history of nephrolithiasis.    Incontinence and urinary frequency Patient caregiver feels that she has improved somewhat with the Detrol 1 mg twice a day. She has complained of some dry mouth and constipation. She is still experiencing frequency, urgency, nocturia and incontinence.  But, they feel the medication is helping.  Vaginal atrophy Patient's caregiver states that she has not been using the vaginal estrogen cream. She states the patient's daughter does not believe in it.     PMH: Past Medical History:  Diagnosis Date  . Anxiety and depression   . CHF (congestive heart failure) (HCC)   . CKD (chronic kidney disease), stage III   . HLD (hyperlipidemia)   . Hypertension   . Hypothyroidism     Surgical History: Past Surgical History:  Procedure Laterality Date  . CHOLECYSTECTOMY    .  ESOPHAGOGASTRODUODENOSCOPY (EGD) WITH PROPOFOL N/A 02/10/2016   Procedure: ESOPHAGOGASTRODUODENOSCOPY (EGD) WITH PROPOFOL;  Surgeon: Erin Miniumarren Wohl, MD;  Location: [facility] ENDOSCOPY;  Service: Endoscopy;  Laterality: N/A;    Home Medications:    Medication List       Accurate as of 06/08/16 11:46 AM. Always use your most recent med list.          aspirin EC 81 MG tablet Take 1 tablet (81 mg total) by mouth daily.   atorvastatin 20 MG tablet Commonly known as:  LIPITOR Take 20 mg by mouth daily.   Calcium Carbonate-Vitamin D 600-400 MG-UNIT tablet Take 1 tablet by mouth daily.   cephALEXin 250 MG capsule Commonly known as:  KEFLEX Take by mouth.   citalopram 20 MG tablet Commonly known as:  CELEXA Take 20 mg by mouth daily.   donepezil 10 MG tablet Commonly known as:  ARICEPT Take 10 mg by mouth at bedtime.   ferrous sulfate 325 (65 FE) MG tablet Take 325 mg by mouth daily.   furosemide 20 MG tablet Commonly known as:  LASIX Take 1 tablet (20 mg total) by mouth every other day.   HYDROcodone-acetaminophen 5-325 MG tablet Commonly known as:  NORCO/VICODIN Take 0.5-1 tablets by mouth 2 (two) times daily as needed for moderate pain.   levothyroxine 88 MCG tablet Commonly known as:  SYNTHROID, LEVOTHROID Take 88 mcg by mouth daily before breakfast.  multivitamin tablet Take 1 tablet by mouth daily.   pantoprazole 40 MG tablet Commonly known as:  PROTONIX Take 1 tablet (40 mg total) by mouth 2 (two) times daily before a meal.   potassium chloride SA 20 MEQ tablet Commonly known as:  K-DUR,KLOR-CON Take 2 tablets (40 mEq total) by mouth daily.   primidone 50 MG tablet Commonly known as:  MYSOLINE Take 50 mg by mouth 2 (two) times daily.   tolterodine 1 MG tablet Commonly known as:  DETROL Take 1 mg by mouth 2 (two) times daily.   triamcinolone ointment 0.1 % Commonly known as:  KENALOG Apply 1 application topically 2 (two) times daily as needed.        Allergies:  Allergies  Allergen Reactions  . Ace Inhibitors     Other reaction(s): Cough    Family History: Family History  Problem Relation Age of Onset  . Kidney cancer    . Cerebral palsy    . Heart attack    . Stroke      Social History:  reports that she has never smoked. She has never used smokeless tobacco. She reports that she does not drink alcohol or use drugs.  ROS: UROLOGY Frequent Urination?: Yes Hard to postpone urination?: Yes Burning/pain with urination?: No Get up at night to urinate?: Yes Leakage of urine?: Yes Urine stream starts and stops?: No Trouble starting stream?: No Do you have to strain to urinate?: No Blood in urine?: No Urinary tract infection?: Yes Sexually transmitted disease?: No Injury to kidneys or bladder?: No Painful intercourse?: No Weak stream?: No Currently pregnant?: No Vaginal bleeding?: No Last menstrual period?: n  Gastrointestinal Nausea?: No Vomiting?: No Indigestion/heartburn?: No Diarrhea?: No Constipation?: No  Constitutional Fever: No Night sweats?: No Weight loss?: No Fatigue?: No  Skin Skin rash/lesions?: Yes Itching?: Yes  Eyes Blurred vision?: No Double vision?: No  Ears/Nose/Throat Sore throat?: No Sinus problems?: No  Hematologic/Lymphatic Swollen glands?: No Easy bruising?: No  Cardiovascular Leg swelling?: Yes Chest pain?: No  Respiratory Cough?: No Shortness of breath?: Yes  Endocrine Excessive thirst?: No  Musculoskeletal Back pain?: No Joint pain?: No  Neurological Headaches?: No Dizziness?: No  Psychologic Depression?: Yes Anxiety?: Yes  Physical Exam: BP (!) 146/72   Pulse 80   Ht 5' (1.524 m)   Wt 157 lb 14.4 oz (71.6 kg)   BMI 30.84 kg/m   Constitutional: Well nourished. Alert and oriented, No acute distress. HEENT: [place] AT, moist mucus membranes. Trachea midline, no masses. Cardiovascular: No clubbing, cyanosis, or edema. Respiratory: Normal  respiratory effort, no increased work of breathing. Skin: No rashes, bruises or suspicious lesions. Lymph: No cervical or inguinal adenopathy. Neurologic: Grossly intact, no focal deficits, moving all 4 extremities. Psychiatric: Normal mood and affect.  Laboratory Data: Lab Results  Component Value Date   WBC 10.9 05/16/2016   HGB 11.8 (L) 05/16/2016   HCT 34.1 (L) 05/16/2016   MCV 84.5 05/16/2016   PLT 116 (L) 05/16/2016    Lab Results  Component Value Date   CREATININE 0.90 05/18/2016      Assessment & Plan:    1. Urinary frequency  - encouraged patient to increase water intake  - continue the Detrol 1 mg bid  - RTC in 3 months for PVR and symptom recheck    2. Incontinence  - see above   3. Atrophic vaginitis  - Patient's daughter would not approve the use of the vaginal estrogen cream.  Return in about 3  months (around 09/07/2016) for PVR and exam.  These notes generated with voice recognition software. I apologize for typographical errors.  Michiel Cowboy, PA-C  [facility] [address], [address] [place], [place] [address] [phone]

## 2016-09-06 ENCOUNTER — Encounter: Payer: Self-pay | Admitting: Urology

## 2016-09-06 ENCOUNTER — Ambulatory Visit: Payer: Medicare Other | Admitting: Urology

## 2016-10-25 DEATH — deceased

## 2017-04-06 IMAGING — US US EXTREM LOW VENOUS BILAT
1 series · 13 of 24 positions shown · non-contrast
Comparison: None.

CLINICAL DATA: [AGE] with personal history of left lower
extremity DVT, presenting with bilateral lower extremity edema.



[Series 1: us extrem low venous bilat · 0.08mm/px · 13 of 61 slices shown]
[im 1/61]
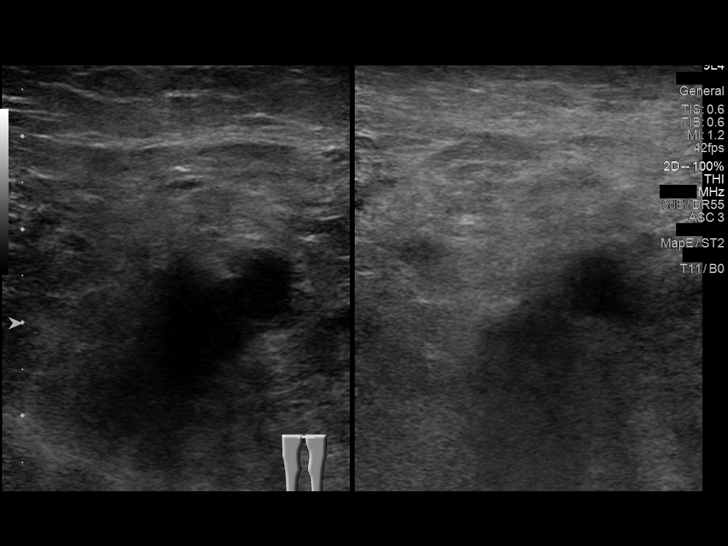
[im 6/61]
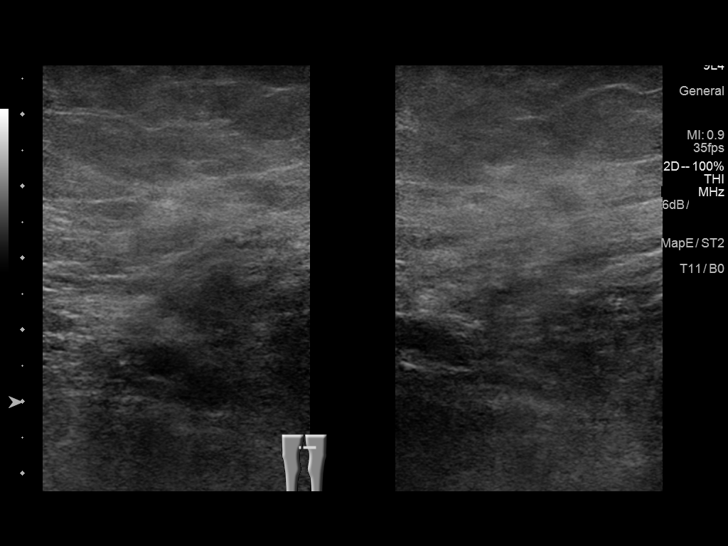
[im 11/61]
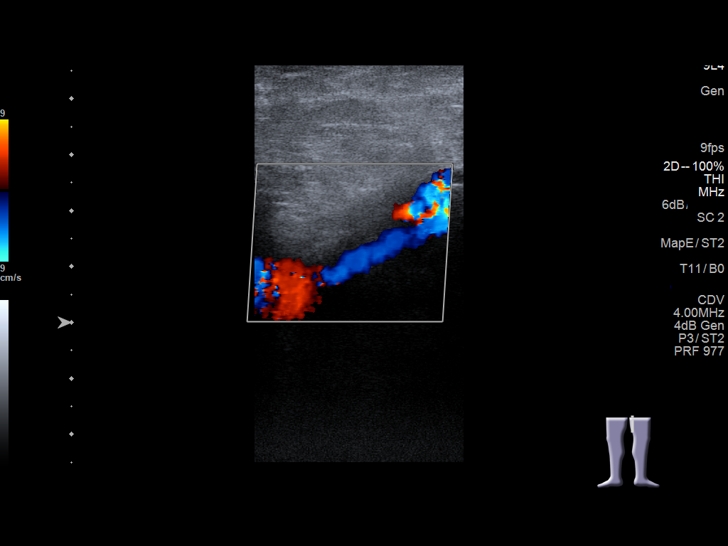
[im 16/61]
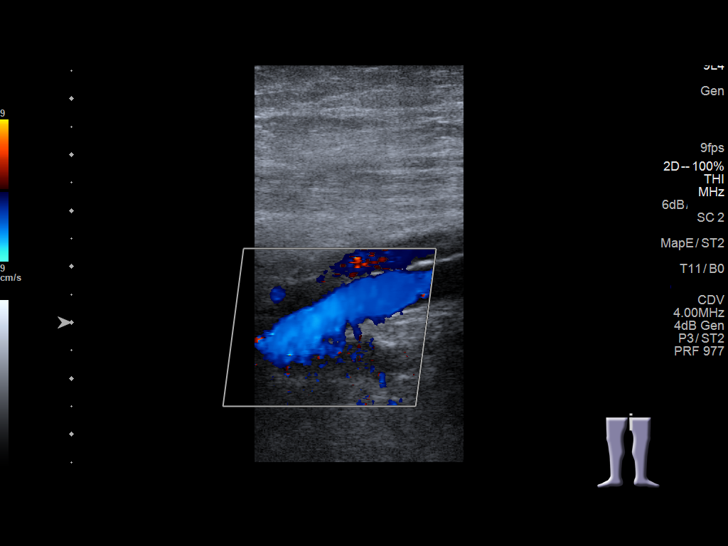
[im 21/61]
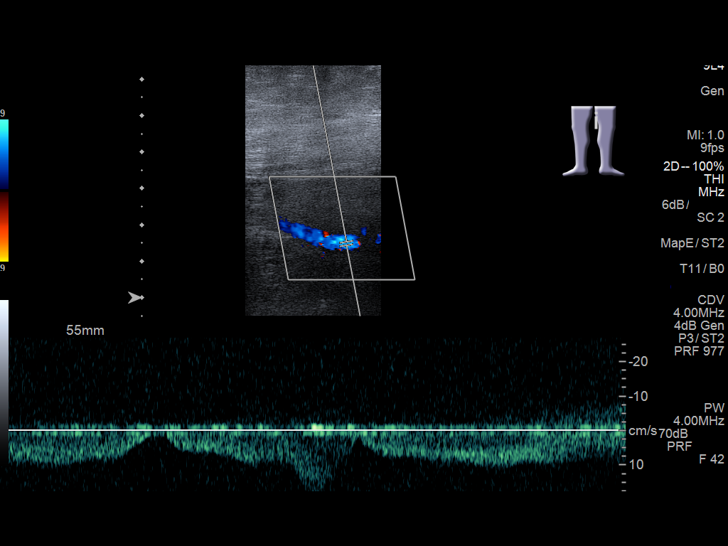
[im 27/61]
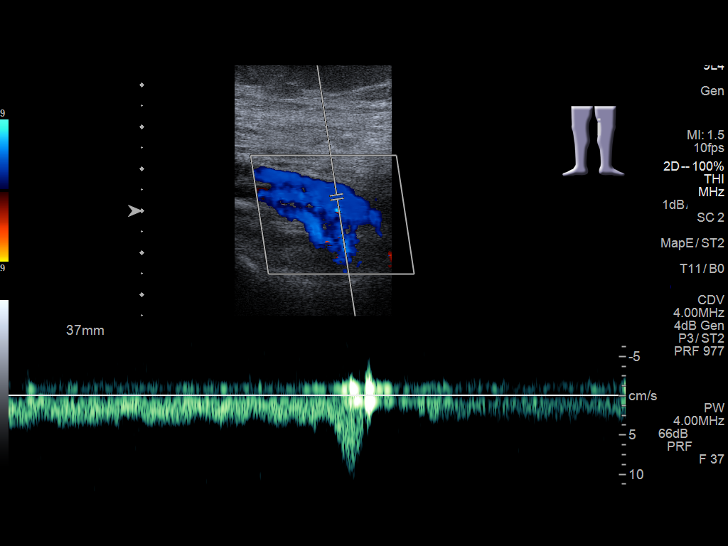
[im 32/61]
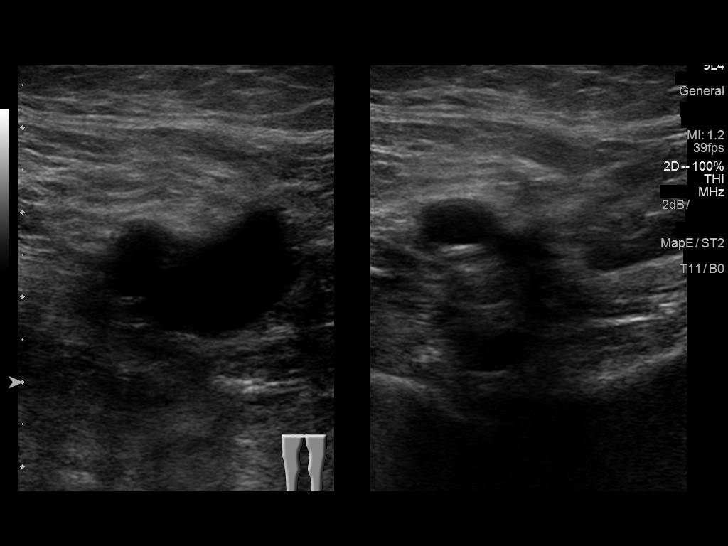
[im 34/61]
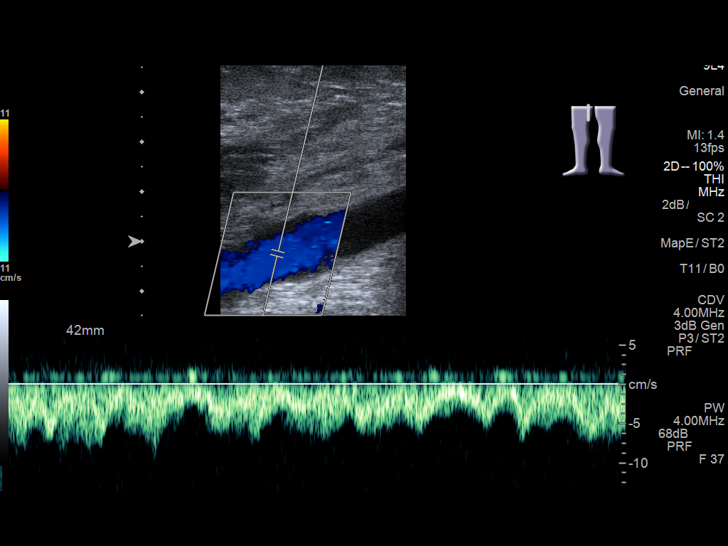
[im 40/61]
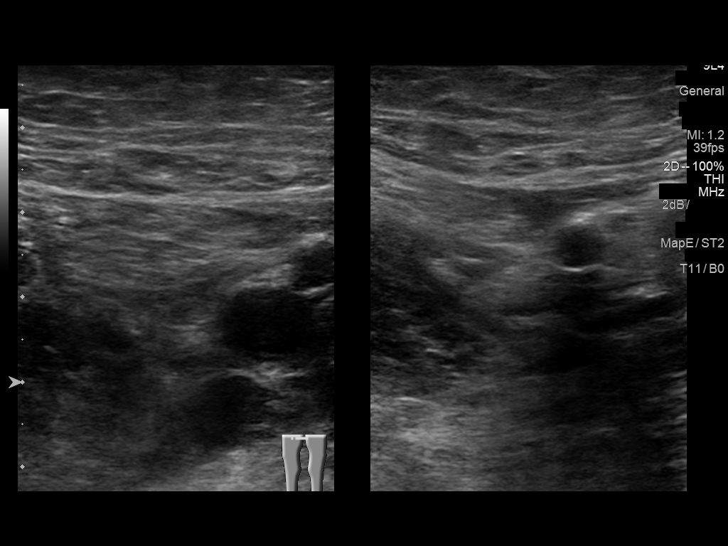
[im 45/61]
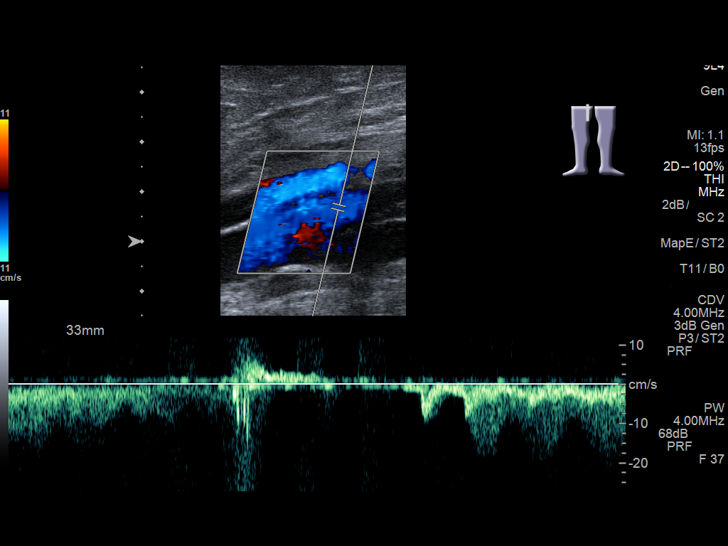
[im 50/61]
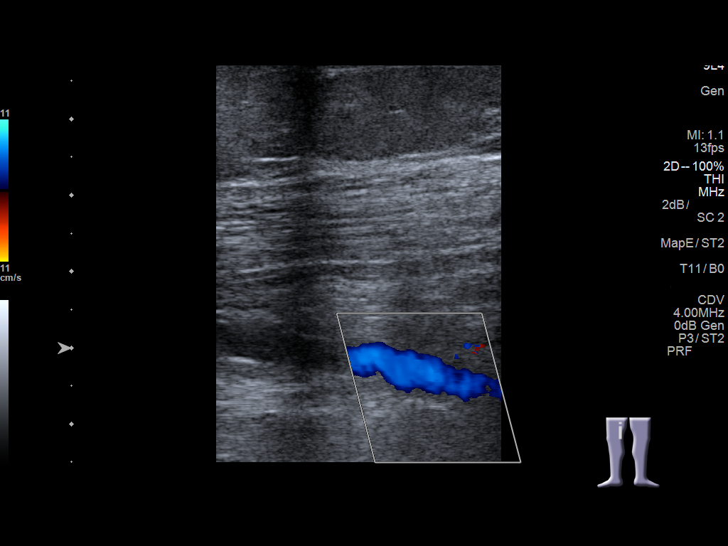
[im 55/61]
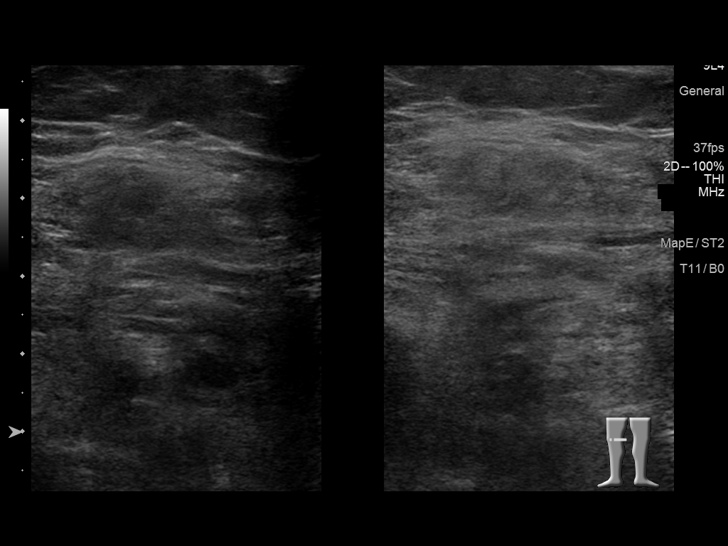
[im 61/61]
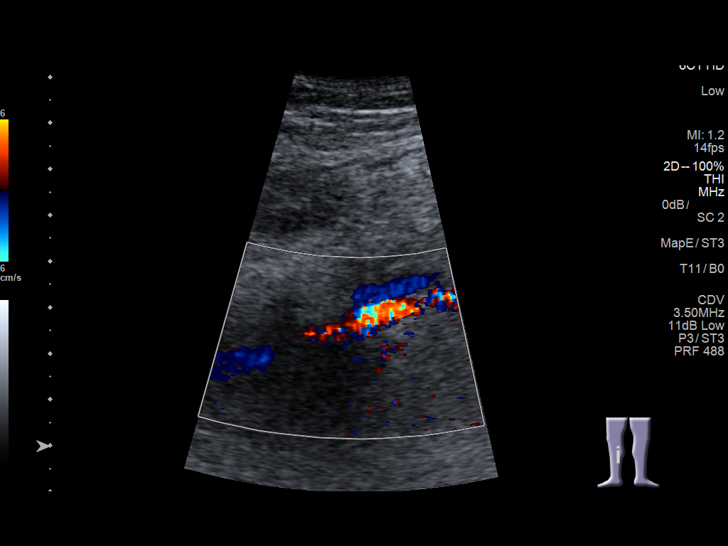

[13 of 24 positions shown; findings below may reference images not displayed]

FINDINGS: RIGHT LOWER EXTREMITY

Common Femoral Vein: No evidence of thrombus. Normal
compressibility, respiratory phasicity and response to augmentation.

Saphenofemoral Junction: No evidence of thrombus. Normal
compressibility and flow on color Doppler imaging.

Profunda Femoral Vein: No evidence of thrombus. Normal
compressibility and flow on color Doppler imaging.

Femoral Vein: No evidence of thrombus. Normal compressibility,
respiratory phasicity and response to augmentation.

Popliteal Vein: No evidence of thrombus. Normal compressibility,
respiratory phasicity and response to augmentation.

Calf Veins: No evidence of thrombus. Normal compressibility and flow
on color Doppler imaging.

Venous Reflux:  Not evaluated.

Other Findings:  None.

LEFT LOWER EXTREMITY

Common Femoral Vein: No evidence of thrombus. Normal
compressibility, respiratory phasicity and response to augmentation.

Saphenofemoral Junction: No evidence of thrombus. Normal
compressibility and flow on color Doppler imaging.

Profunda Femoral Vein: No evidence of thrombus. Normal
compressibility and flow on color Doppler imaging.

Femoral Vein: No evidence of thrombus. Normal compressibility,
respiratory phasicity and response to augmentation.

Popliteal Vein: No evidence of thrombus. Normal compressibility,
respiratory phasicity and response to augmentation.

Calf Veins: No evidence of thrombus. Normal compressibility and flow
on color Doppler imaging.

Venous Reflux:  Not evaluated.

Other Findings:  None.
IMPRESSION: No evidence of DVT involving either the right or left lower
extremity.

## 2017-05-25 IMAGING — DX DG CHEST 1V PORT
1 series · 1 of 1 positions shown · non-contrast
Comparison: August 18, 2013

CLINICAL DATA: Weakness for 3 days.  Hypertension.

EXAM:
PORTABLE CHEST 1 VIEW

[chest ap]
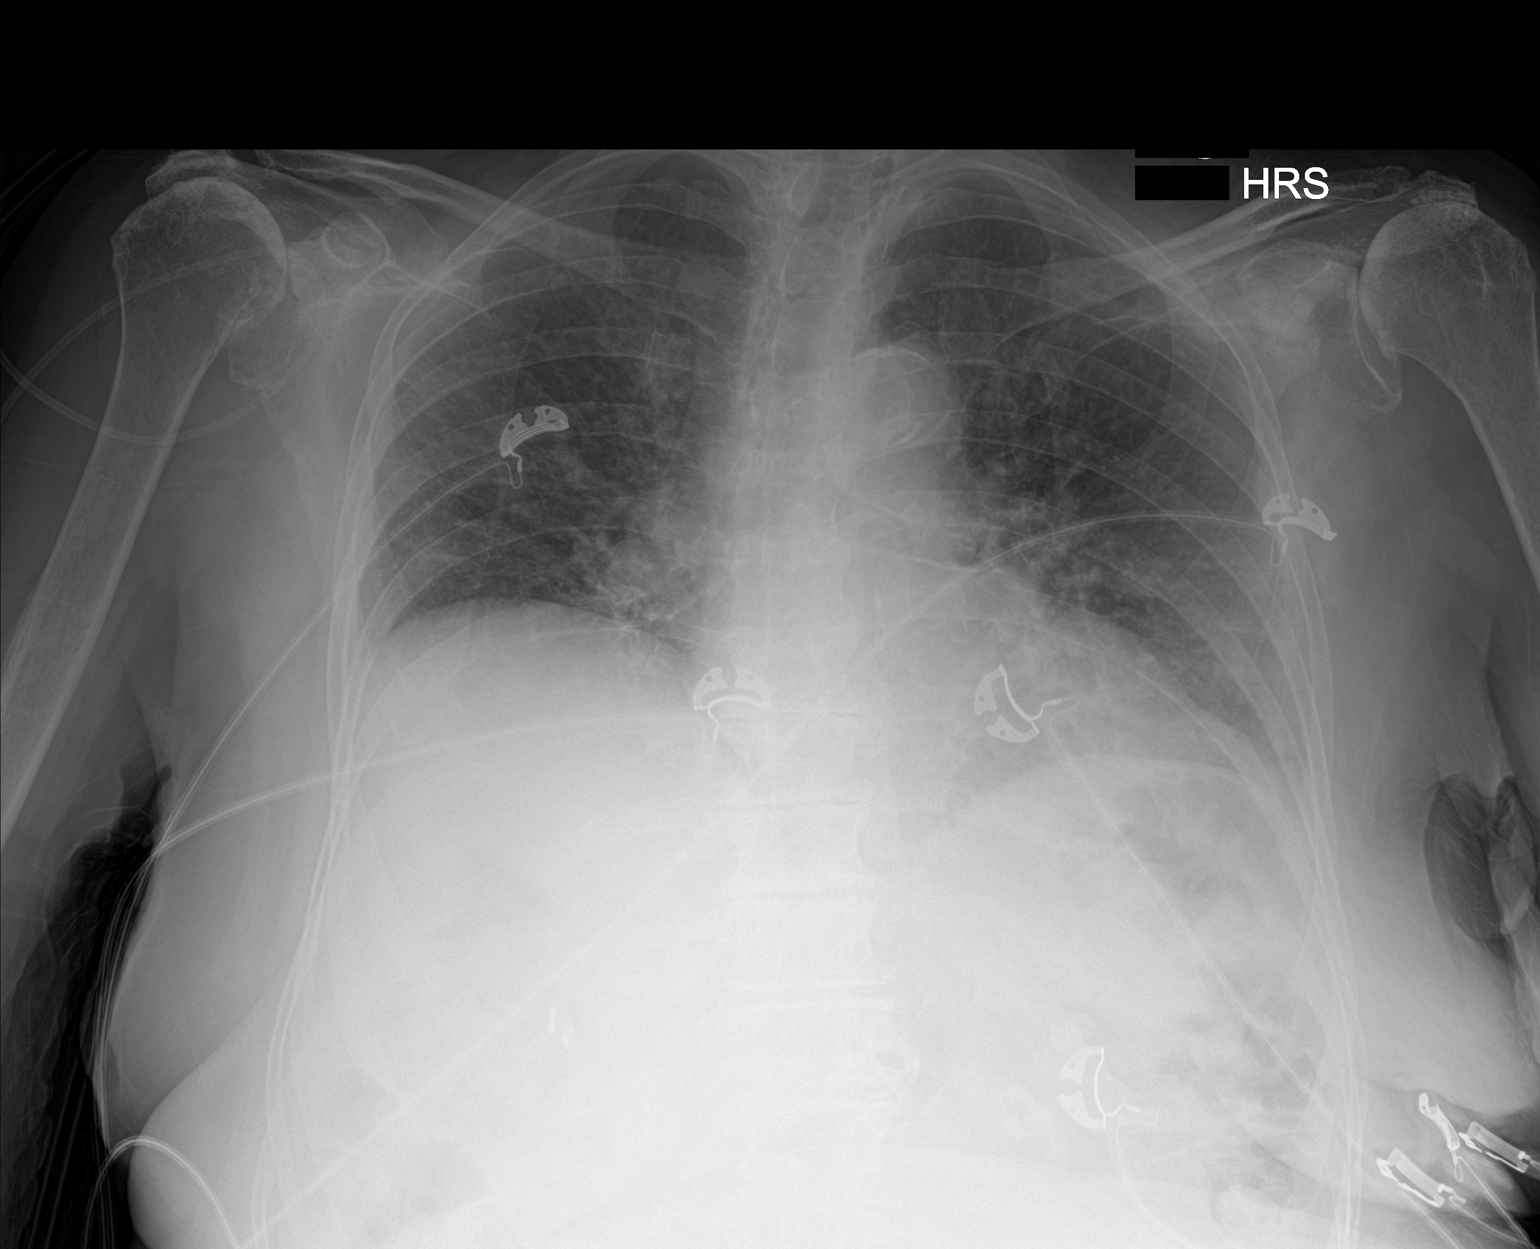

[1 of 1 positions shown; findings below may reference images not displayed]

FINDINGS: There is no edema or consolidation. Heart size and pulmonary
vascularity are normal. There is atherosclerotic calcification in
the aorta. There is degenerative change in each shoulder with
superior migration of each humeral head. There is degenerative
change in the thoracic spine.
IMPRESSION: No edema or consolidation. Aortic atherosclerosis. Chronic rotator
cuff tears bilaterally.
